# Patient Record
Sex: Female | Born: 1990 | Race: White | Hispanic: No | Marital: Married | Smoking: Never smoker
Health system: Southern US, Community
[De-identification: ages and names within clinical notes are randomized; demographics above are authoritative.]

## PROBLEM LIST (undated history)

## (undated) DIAGNOSIS — O139 Gestational [pregnancy-induced] hypertension without significant proteinuria, unspecified trimester: Secondary | ICD-10-CM

## (undated) DIAGNOSIS — F909 Attention-deficit hyperactivity disorder, unspecified type: Secondary | ICD-10-CM

## (undated) DIAGNOSIS — F32A Depression, unspecified: Secondary | ICD-10-CM

## (undated) HISTORY — DX: Gestational (pregnancy-induced) hypertension without significant proteinuria, unspecified trimester: O13.9

## (undated) HISTORY — DX: Depression, unspecified: F32.A

---
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
## 2015-07-01 ENCOUNTER — Encounter (HOSPITAL_COMMUNITY): Payer: Self-pay

## 2015-07-01 DIAGNOSIS — Z79899 Other long term (current) drug therapy: Secondary | ICD-10-CM | POA: Diagnosis not present

## 2015-07-01 DIAGNOSIS — R197 Diarrhea, unspecified: Secondary | ICD-10-CM | POA: Insufficient documentation

## 2015-07-01 DIAGNOSIS — R000 Tachycardia, unspecified: Secondary | ICD-10-CM | POA: Insufficient documentation

## 2015-07-01 DIAGNOSIS — Z3202 Encounter for pregnancy test, result negative: Secondary | ICD-10-CM | POA: Insufficient documentation

## 2015-07-01 DIAGNOSIS — Z793 Long term (current) use of hormonal contraceptives: Secondary | ICD-10-CM | POA: Insufficient documentation

## 2015-07-01 DIAGNOSIS — R112 Nausea with vomiting, unspecified: Secondary | ICD-10-CM | POA: Insufficient documentation

## 2015-07-01 LAB — CBC
HCT: 41.9 % (ref 36.0–46.0)
HEMOGLOBIN: 14.4 g/dL (ref 12.0–15.0)
MCH: 29.4 pg (ref 26.0–34.0)
MCHC: 34.4 g/dL (ref 30.0–36.0)
MCV: 85.5 fL (ref 78.0–100.0)
Platelets: 369 10*3/uL (ref 150–400)
RBC: 4.9 MIL/uL (ref 3.87–5.11)
RDW: 12.1 % (ref 11.5–15.5)
WBC: 8.6 10*3/uL (ref 4.0–10.5)

## 2015-07-01 LAB — COMPREHENSIVE METABOLIC PANEL
ALT: 19 U/L (ref 14–54)
AST: 20 U/L (ref 15–41)
Albumin: 3.8 g/dL (ref 3.5–5.0)
Alkaline Phosphatase: 66 U/L (ref 38–126)
Anion gap: 11 (ref 5–15)
BUN: 15 mg/dL (ref 6–20)
CALCIUM: 9.9 mg/dL (ref 8.9–10.3)
CHLORIDE: 103 mmol/L (ref 101–111)
CO2: 25 mmol/L (ref 22–32)
Creatinine, Ser: 0.93 mg/dL (ref 0.44–1.00)
Glucose, Bld: 126 mg/dL — ABNORMAL HIGH (ref 65–99)
POTASSIUM: 3.8 mmol/L (ref 3.5–5.1)
Sodium: 139 mmol/L (ref 135–145)
Total Bilirubin: 0.7 mg/dL (ref 0.3–1.2)
Total Protein: 7.7 g/dL (ref 6.5–8.1)

## 2015-07-01 LAB — URINALYSIS, ROUTINE W REFLEX MICROSCOPIC
Glucose, UA: NEGATIVE mg/dL
HGB URINE DIPSTICK: NEGATIVE
Ketones, ur: 15 mg/dL — AB
Leukocytes, UA: NEGATIVE
Nitrite: NEGATIVE
Protein, ur: 30 mg/dL — AB
SPECIFIC GRAVITY, URINE: 1.042 — AB (ref 1.005–1.030)
pH: 6 (ref 5.0–8.0)

## 2015-07-01 LAB — I-STAT BETA HCG BLOOD, ED (MC, WL, AP ONLY)

## 2015-07-01 LAB — URINE MICROSCOPIC-ADD ON: RBC / HPF: NONE SEEN RBC/hpf (ref 0–5)

## 2015-07-01 LAB — LIPASE, BLOOD: LIPASE: 20 U/L (ref 11–51)

## 2015-07-01 MED ORDER — ONDANSETRON 4 MG PO TBDP
ORAL_TABLET | ORAL | Status: AC
  Filled 2015-07-01: qty 1

## 2015-07-01 MED ORDER — ONDANSETRON 4 MG PO TBDP
4.0000 mg | ORAL_TABLET | Freq: Once | ORAL | Status: AC | PRN
  Administered 2015-07-01: 4 mg via ORAL

## 2015-07-01 NOTE — ED Notes (Signed)
Mother of patient asked this RN to check on her daughter as she felt warmer to mother. Oral temp currently stable. HR improved at this time.

## 2015-07-01 NOTE — ED Notes (Signed)
Onset yesterday 7pm vomiting x 20 today- yellow, diarrhea x 7 today- clear, watery. No fever, cold/cough, sore throat.  Unable to tolerate PO fluids.  Last urinated 2 hours ago, mouth moist.

## 2015-07-02 ENCOUNTER — Emergency Department (HOSPITAL_COMMUNITY)
Admission: EM | Admit: 2015-07-02 | Discharge: 2015-07-02 | Disposition: A | Discharge status: Home / Self Care | Payer: BLUE CROSS/BLUE SHIELD | Attending: Emergency Medicine | Admitting: Emergency Medicine

## 2015-07-02 DIAGNOSIS — R112 Nausea with vomiting, unspecified: Secondary | ICD-10-CM

## 2015-07-02 DIAGNOSIS — R197 Diarrhea, unspecified: Secondary | ICD-10-CM

## 2015-07-02 MED ORDER — SODIUM CHLORIDE 0.9 % IV BOLUS (SEPSIS)
1000.0000 mL | Freq: Once | INTRAVENOUS | Status: AC
  Administered 2015-07-02: 1000 mL via INTRAVENOUS

## 2015-07-02 MED ORDER — PROMETHAZINE HCL 25 MG PO TABS: 25.0000 mg | ORAL_TABLET | Freq: Four times a day (QID) | ORAL | Status: DC | PRN

## 2015-07-02 MED ORDER — ONDANSETRON 4 MG PO TBDP: 4.0000 mg | ORAL_TABLET | Freq: Three times a day (TID) | ORAL | Status: DC | PRN

## 2015-07-02 NOTE — ED Provider Notes (Signed)
CSN: [id number]     Arrival date & time 07/01/15  2010 History  By signing my name below, I, Phillis HaggisGabriella Gaje, attest that this documentation has been prepared under the direction and in the presence of Benjiman CoreNathan Nazareth Norenberg, MD. Electronically Signed: Phillis HaggisGabriella Gaje, ED Scribe. 07/02/2015. 1:06 AM.   Chief Complaint  Patient presents with  . Emesis  . Diarrhea   The history is provided by the patient. No language interpreter was used.  HPI Comments: Sonya Ali is a 25 y.o. female who presents to the Emergency Department complaining of vomiting x20 and clear, watery diarrhea x7 onset one day ago. Pt's nausea has improved with Zofran given in the ED. She has not had diarrhea since arriving to the ED, but also states that she has not eaten much today. She is unable to tolerate PO fluids. She denies fever, chills, cough, or sore throat. She denies allergies to medication or chance of pregnancy.  No past medical history on file. Past Surgical History  Procedure Laterality Date  . Leg surgery  2007    osteomylitis   No family history on file. Social History  Substance Use Topics  . Smoking status: Never Smoker   . Smokeless tobacco: Not on file  . Alcohol Use: Yes     Comment: rarely   OB History    No data available     Review of Systems  Constitutional: Negative for fever and chills.  HENT: Negative for sore throat.   Respiratory: Negative for cough.   Gastrointestinal: Positive for nausea, vomiting and diarrhea.  All other systems reviewed and are negative.  Allergies  Review of patient's allergies indicates no known allergies.  Home Medications   Prior to Admission medications   Medication Sig Start Date End Date Taking? Authorizing Provider  amphetamine-dextroamphetamine (ADDERALL) 30 MG tablet Take 30 mg by mouth 2 (two) times daily.   Yes Historical Provider, MD  Norethindrone-Ethinyl Estradiol-Fe Biphas (LO LOESTRIN FE) 1 MG-10 MCG / 10 MCG tablet Take 1 tablet by mouth  daily.   Yes Historical Provider, MD  ondansetron (ZOFRAN-ODT) 4 MG disintegrating tablet Take 1 tablet (4 mg total) by mouth every 8 (eight) hours as needed for nausea or vomiting. 07/02/15   Benjiman CoreNathan Brenetta Penny, MD  promethazine (PHENERGAN) 25 MG tablet Take 1 tablet (25 mg total) by mouth every 6 (six) hours as needed for nausea. 07/02/15   Benjiman CoreNathan Taylormarie Register, MD   BP 121/66 mmHg  Pulse 99  Temp(Src) 98.7 F (37.1 C) (Oral)  Resp 16  Ht 5\' 4"  (1.626 m)  Wt 208 lb (94.348 kg)  BMI 35.69 kg/m2  SpO2 96%  LMP 06/03/2015 Physical Exam  Constitutional: She is oriented to person, place, and time. She appears well-developed and well-nourished.  HENT:  Head: Normocephalic and atraumatic.  Eyes: EOM are normal. Pupils are equal, round, and reactive to light.  Neck: Normal range of motion. Neck supple.  Cardiovascular: Regular rhythm and normal heart sounds.  Tachycardia present.  Exam reveals no gallop and no friction rub.   No murmur heard. Mild tachycardia  Pulmonary/Chest: Effort normal and breath sounds normal. She has no wheezes.  Abdominal: Soft. There is no tenderness.  Musculoskeletal: Normal range of motion.  Neurological: She is alert and oriented to person, place, and time.  Skin: Skin is warm and dry.  Psychiatric: She has a normal mood and affect. Her behavior is normal.  Nursing note and vitals reviewed.   ED Course  Procedures (including critical care time) DIAGNOSTIC  STUDIES: Oxygen Saturation is 99% on RA, normal by my interpretation.    COORDINATION OF CARE: 1:01 AM-Discussed treatment plan which includes labs and IV fluids with pt at bedside and pt agreed to plan.    Labs Review Labs Reviewed  COMPREHENSIVE METABOLIC PANEL - Abnormal; Notable for the following:    Glucose, Bld 126 (*)    All other components within normal limits  URINALYSIS, ROUTINE W REFLEX MICROSCOPIC (NOT AT [facility]) - Abnormal; Notable for the following:    Color, Urine AMBER (*)    Specific  Gravity, Urine 1.042 (*)    Bilirubin Urine SMALL (*)    Ketones, ur 15 (*)    Protein, ur 30 (*)    All other components within normal limits  URINE MICROSCOPIC-ADD ON - Abnormal; Notable for the following:    Squamous Epithelial / LPF 6-30 (*)    Bacteria, UA RARE (*)    All other components within normal limits  LIPASE, BLOOD  CBC  I-STAT BETA HCG BLOOD, ED ([facility], [facility], [facility] ONLY)    Imaging Review No results found. I have personally reviewed and evaluated these lab results as part of my medical decision-making.   EKG Interpretation None      MDM   Final diagnoses:  Nausea vomiting and diarrhea    Patient nausea vomiting diarrhea. Feels better after treatment. Has tolerated orals will be discharged home. I personally performed the services described in this documentation, which was scribed in my presence. The recorded information has been reviewed and is accurate.      Benjiman Core, MD 07/02/15 [phone]

## 2015-07-02 NOTE — Discharge Instructions (Signed)

## 2017-07-28 ENCOUNTER — Emergency Department (HOSPITAL_COMMUNITY)
Admission: EM | Admit: 2017-07-28 | Discharge: 2017-07-28 | Discharge status: Against Medical Advice | Payer: BLUE CROSS/BLUE SHIELD

## 2017-07-28 NOTE — ED Notes (Signed)
Patient reports that she will go to urgent care in the morning. Does not want to wait.

## 2018-11-18 ENCOUNTER — Other Ambulatory Visit: Payer: Self-pay

## 2018-11-18 ENCOUNTER — Inpatient Hospital Stay (HOSPITAL_COMMUNITY)
Admission: AD | Admit: 2018-11-18 | Discharge: 2018-11-19 | Disposition: A | Discharge status: Home / Self Care | Payer: BC Managed Care – PPO | Attending: Obstetrics and Gynecology | Admitting: Obstetrics and Gynecology

## 2018-11-18 ENCOUNTER — Encounter (HOSPITAL_COMMUNITY): Payer: Self-pay

## 2018-11-18 DIAGNOSIS — Z3A08 8 weeks gestation of pregnancy: Secondary | ICD-10-CM | POA: Insufficient documentation

## 2018-11-18 DIAGNOSIS — Z3A01 Less than 8 weeks gestation of pregnancy: Secondary | ICD-10-CM | POA: Diagnosis not present

## 2018-11-18 DIAGNOSIS — O210 Mild hyperemesis gravidarum: Secondary | ICD-10-CM | POA: Diagnosis present

## 2018-11-18 DIAGNOSIS — O219 Vomiting of pregnancy, unspecified: Secondary | ICD-10-CM

## 2018-11-18 DIAGNOSIS — O211 Hyperemesis gravidarum with metabolic disturbance: Secondary | ICD-10-CM | POA: Diagnosis not present

## 2018-11-18 LAB — URINALYSIS, ROUTINE W REFLEX MICROSCOPIC
Bilirubin Urine: NEGATIVE
Glucose, UA: NEGATIVE mg/dL
Hgb urine dipstick: NEGATIVE
Ketones, ur: NEGATIVE mg/dL
Nitrite: NEGATIVE
Protein, ur: NEGATIVE mg/dL
Specific Gravity, Urine: 1.025 (ref 1.005–1.030)
pH: 5 (ref 5.0–8.0)

## 2018-11-18 LAB — POCT PREGNANCY, URINE: Preg Test, Ur: POSITIVE — AB

## 2018-11-18 MED ORDER — PROMETHAZINE HCL 25 MG/ML IJ SOLN
12.5000 mg | Freq: Once | INTRAMUSCULAR | Status: AC
  Administered 2018-11-19: 12.5 mg via INTRAVENOUS
  Filled 2018-11-18: qty 1

## 2018-11-18 MED ORDER — DEXAMETHASONE SODIUM PHOSPHATE 10 MG/ML IJ SOLN
10.0000 mg | Freq: Once | INTRAMUSCULAR | Status: AC
  Administered 2018-11-18: 10 mg via INTRAVENOUS
  Filled 2018-11-18: qty 1

## 2018-11-18 MED ORDER — SODIUM CHLORIDE 0.9 % IV SOLN
INTRAVENOUS | Status: DC
  Administered 2018-11-18: via INTRAVENOUS

## 2018-11-18 NOTE — MAU Provider Note (Signed)
Chief Complaint: Emesis During Pregnancy   First Provider Initiated Contact with Patient 11/18/18 2316        SUBJECTIVE HPI: Sonya Ali is a 28 y.o. G1P0 at [redacted]w[redacted]d by LMP who presents to maternity admissions reporting vomiting today.  Recently found out she was pregnant.  Today has not been able to keep anything down.  States her doctor told her to come in for IV.Marland Kitchen She denies vaginal bleeding, vaginal itching/burning, urinary symptoms, h/a, dizziness, or fever/chills.     RN Note: Found out 4 days ago - pregnant and not able to keep anything down. Vomited 3-4 times today.  Tried throughout the day: saltines, water, sparkling water, pedilyte, oatmeal, applesauce- really didn't keep down.  A lot of nausea.  No bleeding.   History reviewed. No pertinent past medical history. Past Surgical History:  Procedure Laterality Date  . LEG SURGERY  2007   osteomylitis   Social History   Socioeconomic History  . Marital status: Married    Spouse name: Not on file  . Number of children: Not on file  . Years of education: Not on file  . Highest education level: Not on file  Occupational History  . Not on file  Social Needs  . Financial resource strain: Not on file  . Food insecurity    Worry: Not on file    Inability: Not on file  . Transportation needs    Medical: Not on file    Non-medical: Not on file  Tobacco Use  . Smoking status: Never Smoker  . Smokeless tobacco: Never Used  Substance and Sexual Activity  . Alcohol use: Not Currently    Comment: rarely  . Drug use: No  . Sexual activity: Not Currently  Lifestyle  . Physical activity    Days per week: Not on file    Minutes per session: Not on file  . Stress: Not on file  Relationships  . Social Herbalist on phone: Not on file    Gets together: Not on file    Attends religious service: Not on file    Active member of club or organization: Not on file    Attends meetings of clubs or organizations: Not  on file    Relationship status: Not on file  . Intimate partner violence    Fear of current or ex partner: Not on file    Emotionally abused: Not on file    Physically abused: Not on file    Forced sexual activity: Not on file  Other Topics Concern  . Not on file  Social History Narrative  . Not on file   No current facility-administered medications on file prior to encounter.    Current Outpatient Medications on File Prior to Encounter  Medication Sig Dispense Refill  . amphetamine-dextroamphetamine (ADDERALL) 30 MG tablet Take 30 mg by mouth 2 (two) times daily.    . Norethindrone-Ethinyl Estradiol-Fe Biphas (LO LOESTRIN FE) 1 MG-10 MCG / 10 MCG tablet Take 1 tablet by mouth daily.    . ondansetron (ZOFRAN-ODT) 4 MG disintegrating tablet Take 1 tablet (4 mg total) by mouth every 8 (eight) hours as needed for nausea or vomiting. 10 tablet 0  . promethazine (PHENERGAN) 25 MG tablet Take 1 tablet (25 mg total) by mouth every 6 (six) hours as needed for nausea. 10 tablet 0   No Known Allergies  I have reviewed patient's Past Medical Hx, Surgical Hx, Family Hx, Social Hx, medications and allergies.  ROS:  Review of Systems  Gastrointestinal: Positive for nausea and vomiting. Negative for constipation and diarrhea.  Genitourinary: Negative for pelvic pain and vaginal bleeding.  Musculoskeletal: Negative for back pain.  Neurological: Negative for dizziness.   Review of Systems  Other systems negative   Physical Exam  Physical Exam Patient Vitals for the past 24 hrs:  BP Temp Temp src Pulse Resp SpO2 Height Weight  11/18/18 2103 130/69 98.5 F (36.9 C) Oral 87 16 96 % 5\' 3"  (1.6 m) 88.5 kg   Constitutional: Well-developed female in no acute distress.  Cardiovascular: normal rate Respiratory: normal effort GI: Abd soft, non-tender. Pos BS x 4 MS: Extremities nontender, no edema, normal ROM Neurologic: Alert and oriented x 4.  GU: Neg CVAT.  PELVIC EXAM: deferred  LAB  RESULTS Results for orders placed or performed during the hospital encounter of 11/18/18 (from the past 24 hour(s))  Urinalysis, Routine w reflex microscopic     Status: Abnormal   Collection Time: 11/18/18  9:59 PM  Result Value Ref Range   Color, Urine YELLOW YELLOW   APPearance HAZY (A) CLEAR   Specific Gravity, Urine 1.025 1.005 - 1.030   pH 5.0 5.0 - 8.0   Glucose, UA NEGATIVE NEGATIVE mg/dL   Hgb urine dipstick NEGATIVE NEGATIVE   Bilirubin Urine NEGATIVE NEGATIVE   Ketones, ur NEGATIVE NEGATIVE mg/dL   Protein, ur NEGATIVE NEGATIVE mg/dL   Nitrite NEGATIVE NEGATIVE   Leukocytes,Ua TRACE (A) NEGATIVE   RBC / HPF 0-5 0 - 5 RBC/hpf   WBC, UA 0-5 0 - 5 WBC/hpf   Bacteria, UA RARE (A) NONE SEEN   Squamous Epithelial / LPF 11-20 0 - 5   Mucus PRESENT   Pregnancy, urine POC     Status: Abnormal   Collection Time: 11/18/18 10:02 PM  Result Value Ref Range   Preg Test, Ur POSITIVE (A) NEGATIVE       IMAGING No results found.  MAU Management/MDM: Ordered IV with Normal Saline infusion Will give Phenergan and Decadron for nausea  After one liter of fluid and meds, pt felt much better and tolerated PO intake  ASSESSMENT Single intrauterine pregnancy at 561w4d Nausea and vomiting Mild dehydration  PLAN Discharge home Rx Declegis with instructions on how to take it.  Discussed how to advance diet as tolerated  Pt stable at time of discharge. Encouraged to return here or to other Urgent Care/ED if she develops worsening of symptoms, increase in pain, fever, or other concerning symptoms.    Wynelle BourgeoisMarie Caidan Hubbert CNM, MSN Certified Nurse-Midwife 11/18/2018  11:16 PM

## 2018-11-18 NOTE — MAU Note (Signed)
Found out 4 days ago - pregnant and not able to keep anything down. Vomited 3-4 times today.  Tried throughout the day: saltines, water, sparkling water, pedilyte, oatmeal, applesauce- really didn't keep down.  A lot of nausea.  No bleeding.

## 2018-11-19 MED ORDER — DOXYLAMINE-PYRIDOXINE 10-10 MG PO TBEC: 2.0000 | DELAYED_RELEASE_TABLET | Freq: Every evening | ORAL | 1 refills | Status: DC | PRN

## 2018-11-19 NOTE — Discharge Instructions (Signed)
Morning Sickness ° °Morning sickness is when a woman feels nauseous during pregnancy. This nauseous feeling may or may not come with vomiting. It often occurs in the morning, but it can be a problem at any time of day. Morning sickness is most common during the first trimester. In some cases, it may continue throughout pregnancy. Although morning sickness is unpleasant, it is usually harmless unless the woman develops severe and continual vomiting (hyperemesis gravidarum), a condition that requires more intense treatment. °What are the causes? °The exact cause of this condition is not known, but it seems to be related to normal hormonal changes that occur in pregnancy. °What increases the risk? °You are more likely to develop this condition if: °· You experienced nausea or vomiting before your pregnancy. °· You had morning sickness during a previous pregnancy. °· You are pregnant with more than one baby, such as twins. °What are the signs or symptoms? °Symptoms of this condition include: °· Nausea. °· Vomiting. °How is this diagnosed? °This condition is usually diagnosed based on your signs and symptoms. °How is this treated? °In many cases, treatment is not needed for this condition. Making some changes to what you eat may help to control symptoms. Your health care provider may also prescribe or recommend: °· Vitamin B6 supplements. °· Anti-nausea medicines. °· Ginger. °Follow these instructions at home: °Medicines °· Take over-the-counter and prescription medicines only as told by your health care provider. Do not use any prescription, over-the-counter, or herbal medicines for morning sickness without first talking with your health care provider. °· Taking multivitamins before getting pregnant can prevent or decrease the severity of morning sickness in most women. °Eating and drinking °· Eat a piece of dry toast or crackers before getting out of bed in the morning. °· Eat 5 or 6 small meals a day. °· Eat dry and  bland foods, such as rice or a baked potato. Foods that are high in carbohydrates are often helpful. °· Avoid greasy, fatty, and spicy foods. °· Have someone cook for you if the smell of any food causes nausea and vomiting. °· If you feel nauseous after taking prenatal vitamins, take the vitamins at night or with a snack. °· Snack on protein foods between meals if you are hungry. Nuts, yogurt, and cheese are good options. °· Drink fluids throughout the day. °· Try ginger ale made with real ginger, ginger tea made from fresh grated ginger, or ginger candies. °General instructions °· Do not use any products that contain nicotine or tobacco, such as cigarettes and e-cigarettes. If you need help quitting, ask your health care provider. °· Get an air purifier to keep the air in your house free of odors. °· Get plenty of fresh air. °· Try to avoid odors that trigger your nausea. °· Consider trying these methods to help relieve symptoms: °? Wearing an acupressure wristband. These wristbands are often worn for seasickness. °? Acupuncture. °Contact a health care provider if: °· Your home remedies are not working and you need medicine. °· You feel dizzy or light-headed. °· You are losing weight. °Get help right away if: °· You have persistent and uncontrolled nausea and vomiting. °· You faint. °· You have severe pain in your abdomen. °Summary °· Morning sickness is when a woman feels nauseous during pregnancy. This nauseous feeling may or may not come with vomiting. °· Morning sickness is most common during the first trimester. °· It often occurs in the morning, but it can be a problem at   any time of day. °· In many cases, treatment is not needed for this condition. Making some changes to what you eat may help to control symptoms. °This information is not intended to replace advice given to you by your health care provider. Make sure you discuss any questions you have with your health care provider. °Document Released:  06/05/2006 Document Revised: 03/27/2017 Document Reviewed: 05/17/2016 °Elsevier Patient Education © 2020 [employer] ° °

## 2018-12-03 DIAGNOSIS — Z3490 Encounter for supervision of normal pregnancy, unspecified, unspecified trimester: Secondary | ICD-10-CM | POA: Insufficient documentation

## 2018-12-10 LAB — OB RESULTS CONSOLE HEPATITIS B SURFACE ANTIGEN: Hepatitis B Surface Ag: NEGATIVE

## 2018-12-10 LAB — OB RESULTS CONSOLE HIV ANTIBODY (ROUTINE TESTING): HIV: NONREACTIVE

## 2018-12-10 LAB — OB RESULTS CONSOLE RUBELLA ANTIBODY, IGM: Rubella: IMMUNE

## 2018-12-10 LAB — OB RESULTS CONSOLE ABO/RH: RH Type: POSITIVE

## 2018-12-10 LAB — OB RESULTS CONSOLE GC/CHLAMYDIA
Chlamydia: NEGATIVE
Gonorrhea: NEGATIVE

## 2018-12-10 LAB — OB RESULTS CONSOLE ANTIBODY SCREEN: Antibody Screen: NEGATIVE

## 2018-12-10 LAB — OB RESULTS CONSOLE RPR: RPR: NONREACTIVE

## 2018-12-13 LAB — OB RESULTS CONSOLE VARICELLA ZOSTER ANTIBODY, IGG: Varicella: IMMUNE

## 2019-04-29 NOTE — L&D Delivery Note (Signed)
Delivery Note She made steady progress, reached complete and pushed for less than an hour.  At 3:43 AM a viable female was delivered via Vaginal, Spontaneous (Presentation: Left Occiput Anterior).  APGAR: 8, 9; weight pending.   Placenta status: Spontaneous, Intact.  Cord: 2 vessels with the following complications: None.  Anesthesia: Epidural Episiotomy: Median Lacerations:  none Suture Repair: 3.0 vicryl rapide Est. Blood Loss (mL):  350  Mom to postpartum.  Baby to Couplet care / Skin to Skin.  Will monitor BP, start Procardia if necessary  Zenaida Niece 06/21/2019, 4:09 AM

## 2019-06-14 ENCOUNTER — Other Ambulatory Visit: Payer: Self-pay

## 2019-06-14 ENCOUNTER — Inpatient Hospital Stay (HOSPITAL_COMMUNITY)
Admission: AD | Admit: 2019-06-14 | Discharge: 2019-06-14 | Disposition: A | Discharge status: Home / Self Care | Payer: BC Managed Care – PPO | Attending: Obstetrics and Gynecology | Admitting: Obstetrics and Gynecology

## 2019-06-14 ENCOUNTER — Encounter (HOSPITAL_COMMUNITY): Payer: Self-pay | Admitting: Obstetrics and Gynecology

## 2019-06-14 DIAGNOSIS — O1493 Unspecified pre-eclampsia, third trimester: Secondary | ICD-10-CM

## 2019-06-14 DIAGNOSIS — Z79899 Other long term (current) drug therapy: Secondary | ICD-10-CM | POA: Diagnosis not present

## 2019-06-14 DIAGNOSIS — F909 Attention-deficit hyperactivity disorder, unspecified type: Secondary | ICD-10-CM | POA: Diagnosis not present

## 2019-06-14 DIAGNOSIS — O99343 Other mental disorders complicating pregnancy, third trimester: Secondary | ICD-10-CM | POA: Insufficient documentation

## 2019-06-14 DIAGNOSIS — Z3689 Encounter for other specified antenatal screening: Secondary | ICD-10-CM

## 2019-06-14 DIAGNOSIS — Z3A35 35 weeks gestation of pregnancy: Secondary | ICD-10-CM | POA: Insufficient documentation

## 2019-06-14 DIAGNOSIS — Z3A36 36 weeks gestation of pregnancy: Secondary | ICD-10-CM | POA: Diagnosis not present

## 2019-06-14 HISTORY — DX: Attention-deficit hyperactivity disorder, unspecified type: F90.9

## 2019-06-14 LAB — COMPREHENSIVE METABOLIC PANEL
ALT: 19 U/L (ref 0–44)
AST: 25 U/L (ref 15–41)
Albumin: 2.9 g/dL — ABNORMAL LOW (ref 3.5–5.0)
Alkaline Phosphatase: 149 U/L — ABNORMAL HIGH (ref 38–126)
Anion gap: 13 (ref 5–15)
BUN: 8 mg/dL (ref 6–20)
CO2: 20 mmol/L — ABNORMAL LOW (ref 22–32)
Calcium: 9.7 mg/dL (ref 8.9–10.3)
Chloride: 105 mmol/L (ref 98–111)
Creatinine, Ser: 0.76 mg/dL (ref 0.44–1.00)
GFR calc Af Amer: >60 mL/min (ref >60)
GFR calc non Af Amer: >60 mL/min (ref >60)
Glucose, Bld: 79 mg/dL (ref 70–99)
Potassium: 3.8 mmol/L (ref 3.5–5.1)
Sodium: 138 mmol/L (ref 135–145)
Total Bilirubin: 0.5 mg/dL (ref 0.3–1.2)
Total Protein: 6.1 g/dL — ABNORMAL LOW (ref 6.5–8.1)

## 2019-06-14 LAB — CBC
HCT: 38.3 % (ref 36.0–46.0)
Hemoglobin: 13 g/dL (ref 12.0–15.0)
MCH: 30.4 pg (ref 26.0–34.0)
MCHC: 33.9 g/dL (ref 30.0–36.0)
MCV: 89.5 fL (ref 80.0–100.0)
Platelets: 338 10*3/uL (ref 150–400)
RBC: 4.28 MIL/uL (ref 3.87–5.11)
RDW: 12.6 % (ref 11.5–15.5)
WBC: 11.5 10*3/uL — ABNORMAL HIGH (ref 4.0–10.5)
nRBC: 0 % (ref 0.0–0.2)

## 2019-06-14 LAB — PROTEIN / CREATININE RATIO, URINE
Creatinine, Urine: 33.89 mg/dL
Protein Creatinine Ratio: 3.22 mg/mg{Cre} — ABNORMAL HIGH (ref 0.00–0.15)
Total Protein, Urine: 109 mg/dL

## 2019-06-14 NOTE — MAU Provider Note (Signed)
Chief Complaint:  Hypertension and fetal tachycardia   First Provider Initiated Contact with Patient 06/14/19 1712     HPI: Sonya Ali is a 29 y.o. G1P0 at [redacted]w[redacted]d who presents to maternity admissions from the office for fetal monitoring. Patient was in the office for a routine ob appointment today. On doppler, FHR was 170s-200s. Was sent for monitoring. Reports normal fetal movement.  While at her appointment, Dr. Jackelyn Knife checked her cervix. Was told her cervix is closed but noted bright red bleeding immediately after the exam.  Denies history of hypertension. Had an elevated BP in the office a few weeks ago. Denies headache, visual disturbance, or epigastric pain.   Pregnancy Course: [facility]  Past Medical History:  Diagnosis Date  . ADHD    OB History  Gravida Para Term Preterm AB Living  1            SAB TAB Ectopic Multiple Live Births               # Outcome Date GA Lbr Len/2nd Weight Sex Delivery Anes PTL Lv  1 Current            Past Surgical History:  Procedure Laterality Date  . LEG SURGERY  2007   osteomylitis   History reviewed. No pertinent family history. Social History   Tobacco Use  . Smoking status: Never Smoker  . Smokeless tobacco: Never Used  Substance Use Topics  . Alcohol use: Not Currently    Comment: rarely  . Drug use: No   No Known Allergies Medications Prior to Admission  Medication Sig Dispense Refill Last Dose  . amphetamine-dextroamphetamine (ADDERALL) 30 MG tablet Take 30 mg by mouth 2 (two) times daily.   06/14/2019 at Unknown time  . Doxylamine-Pyridoxine (DICLEGIS) 10-10 MG TBEC Take 2 tablets by mouth at bedtime as needed. 100 tablet 1   . ondansetron (ZOFRAN-ODT) 4 MG disintegrating tablet Take 1 tablet (4 mg total) by mouth every 8 (eight) hours as needed for nausea or vomiting. 10 tablet 0   . promethazine (PHENERGAN) 25 MG tablet Take 1 tablet (25 mg total) by mouth every 6 (six) hours as needed for nausea. 10 tablet  0     I have reviewed patient's Past Medical Hx, Surgical Hx, Family Hx, Social Hx, medications and allergies.   ROS:  Review of Systems  Constitutional: Negative.   Eyes: Negative for visual disturbance.  Gastrointestinal: Negative.   Genitourinary: Positive for vaginal bleeding. Negative for dysuria and vaginal discharge.  Neurological: Negative for headaches.    Physical Exam   Patient Vitals for the past 24 hrs:  BP Temp Temp src Pulse Resp SpO2  06/14/19 1900 131/79 - - (!) 111 - -  06/14/19 1845 121/67 - - (!) 113 - 99 %  06/14/19 1830 103/64 - - 99 - 98 %  06/14/19 1815 135/76 - - (!) 122 - 99 %  06/14/19 1801 138/68 - - (!) 118 - -  06/14/19 1745 136/81 - - (!) 109 - 98 %  06/14/19 1730 (!) 147/92 - - (!) 126 - 99 %  06/14/19 1724 (!) 140/99 - - (!) 119 - -  06/14/19 1708 - - - - - 99 %  06/14/19 1703 (!) 140/93 98.6 F (37 C) Oral (!) 126 20 99 %    Constitutional: Well-developed, well-nourished female in no acute distress.  Cardiovascular: normal rate & rhythm, no murmur Respiratory: normal effort, lung sounds clear throughout GI: Abd soft, non-tender, gravid  appropriate for gestational age. Pos BS x 4 MS: Extremities nontender, non pitting edema, normal ROM Neurologic: Alert and oriented x 4.  GU: minimal amount of dark red blood on pad. No active bleeding.   NST:  Baseline: 145 bpm, Variability: Good {> 6 bpm), Accelerations: Reactive and Decelerations: Absent   Labs: Results for orders placed or performed during the hospital encounter of 06/14/19 (from the past 24 hour(s))  Protein / creatinine ratio, urine     Status: Abnormal   Collection Time: 06/14/19  5:36 PM  Result Value Ref Range   Creatinine, Urine 33.89 mg/dL   Total Protein, Urine 109 mg/dL   Protein Creatinine Ratio 3.22 (H) 0.00 - 0.15 mg/mg[Cre]  CBC     Status: Abnormal   Collection Time: 06/14/19  5:50 PM  Result Value Ref Range   WBC 11.5 (H) 4.0 - 10.5 K/uL   RBC 4.28 3.87 - 5.11  MIL/uL   Hemoglobin 13.0 12.0 - 15.0 g/dL   HCT 38.3 36.0 - 46.0 %   MCV 89.5 80.0 - 100.0 fL   MCH 30.4 26.0 - 34.0 pg   MCHC 33.9 30.0 - 36.0 g/dL   RDW 12.6 11.5 - 15.5 %   Platelets 338 150 - 400 K/uL   nRBC 0.0 0.0 - 0.2 %  Comprehensive metabolic panel     Status: Abnormal   Collection Time: 06/14/19  5:50 PM  Result Value Ref Range   Sodium 138 135 - 145 mmol/L   Potassium 3.8 3.5 - 5.1 mmol/L   Chloride 105 98 - 111 mmol/L   CO2 20 (L) 22 - 32 mmol/L   Glucose, Bld 79 70 - 99 mg/dL   BUN 8 6 - 20 mg/dL   Creatinine, Ser 0.76 0.44 - 1.00 mg/dL   Calcium 9.7 8.9 - 10.3 mg/dL   Total Protein 6.1 (L) 6.5 - 8.1 g/dL   Albumin 2.9 (L) 3.5 - 5.0 g/dL   AST 25 15 - 41 U/L   ALT 19 0 - 44 U/L   Alkaline Phosphatase 149 (H) 38 - 126 U/L   Total Bilirubin 0.5 0.3 - 1.2 mg/dL   GFR calc non Af Amer >60 >60 mL/min   GFR calc Af Amer >60 >60 mL/min   Anion gap 13 5 - 15    Imaging:  No results found.  MAU Course: Orders Placed This Encounter  Procedures  . CBC  . Comprehensive metabolic panel  . Protein / creatinine ratio, urine  . Discharge patient   No orders of the defined types were placed in this encounter.   MDM: Reactive NST with normal baseline. Active fetal movement.   RH positive per prenatal record. Pt reports bright red bleeding immediately after cervical exam in the office. Denies abdominal pain. Bleeding stopped while in MAU.   Elevated BPs. Not severe range. PEC labs ordered. Patient had elevated BP in the office a few weeks ago but otherwise denies history of hypertension. Platelets, AST/ALT, & creatinine normal. Urine protein creatinine ratio elevated at 3.22 - no baseline labs to compare to.   Contacted Dr. Melba Coon to facilitate appropriate follow up. Patient is scheduled for next ob appointment on Monday but will be 37 wks on Sunday. Per Dr. Melba Coon, the office will contact the patient tomorrow to manage follow up appointment and schedule induction.    Assessment: 1. Pre-eclampsia in third trimester   2. [redacted] weeks gestation of pregnancy   3. NST (non-stress test) reactive  Plan: Discharge home in stable condition.  Preeclampsia & labor precautions Patient instructed to call the office tomorrow afternoon if she hasn't heard from her OB  Follow-up Information    [facility] Follow [facility].   Why: the office will call you tomorrow to schedule follow up appointment Contact information: [address]  [address] [place] [address] [phone]           Allergies as of 06/14/2019   No Known Allergies     Medication List    STOP taking these medications   amphetamine-dextroamphetamine 30 MG tablet Commonly known as: ADDERALL   Doxylamine-Pyridoxine 10-10 MG Tbec Commonly known as: Diclegis   ondansetron 4 MG disintegrating tablet Commonly known as: ZOFRAN-ODT   promethazine 25 MG tablet Commonly known as: Jeanne Ivan, NP 06/14/2019 7:31 PM

## 2019-06-14 NOTE — MAU Note (Signed)
Routine appt today, FH was up 170's  Up to 200. Sent over for further monitoring.   Has been having cramps.  Dr checked cervix (closed), "looked like a crime scene".  No prior hx of bleeding during preg. Still seeing when got here.

## 2019-06-14 NOTE — Discharge Instructions (Signed)
Hypertension During Pregnancy °High blood pressure (hypertension) is when the force of blood pumping through the arteries is too strong. Arteries are blood vessels that carry blood from the heart throughout the body. Hypertension during pregnancy can be mild or severe. Severe hypertension during pregnancy (preeclampsia) is a medical emergency that requires prompt evaluation and treatment. °Different types of hypertension can happen during pregnancy. These include: °· Chronic hypertension. This happens when you had high blood pressure before you became pregnant, and it continues during the pregnancy. Hypertension that develops before you are [redacted] weeks pregnant and continues during the pregnancy is also called chronic hypertension. If you have chronic hypertension, it will not go away after you have your baby. You will need follow-up visits with your health care provider after you have your baby. Your doctor may want you to keep taking medicine for your blood pressure. °· Gestational hypertension. This is hypertension that develops after the 20th week of pregnancy. Gestational hypertension usually goes away after you have your baby, but your health care provider will need to monitor your blood pressure to make sure that it is getting better. °· Preeclampsia. This is severe hypertension during pregnancy. This can cause serious complications for you and your baby and can also cause complications for you after the delivery of your baby. °· Postpartum preeclampsia. You may develop severe hypertension after giving birth. This usually occurs within 48 hours after childbirth but may occur up to 6 weeks after giving birth. This is rare. °How does this affect me? °Women who have hypertension during pregnancy have a greater chance of developing hypertension later in life or during future pregnancies. In some cases, hypertension during pregnancy can cause serious complications, such as: °· Stroke. °· Heart attack. °· Injury to  other organs, such as kidneys, lungs, or liver. °· Preeclampsia. °· Convulsions or seizures. °· Placental abruption. °How does this affect my baby? °Hypertension during pregnancy can affect your baby. Your baby may: °· Be born early (prematurely). °· Not weigh as much as he or she should at birth (low birth weight). °· Not tolerate labor well, leading to an unplanned cesarean delivery. °What are the risks? °There are certain factors that make it more likely for you to develop hypertension during pregnancy. These include: °· Having hypertension during a previous pregnancy. °· Being overweight. °· Being age 35 or older. °· Being pregnant for the first time. °· Being pregnant with more than one baby. °· Becoming pregnant using fertilization methods, such as IVF (in vitro fertilization). °· Having other medical problems, such as diabetes, kidney disease, or lupus. °· Having a family history of hypertension. °What can I do to lower my risk? °The exact cause of hypertension during pregnancy is not known. You may be able to lower your risk by: °· Maintaining a healthy weight. °· Eating a healthy and balanced diet. °· Following your health care provider's instructions about treating any long-term conditions that you had before becoming pregnant. °It is very important to keep all of your prenatal care appointments. Your health care provider will check your blood pressure and make sure that your pregnancy is progressing as expected. If a problem is found, early treatment can prevent complications. °How is this treated? °Treatment for hypertension during pregnancy varies depending on the type of hypertension you have and how serious it is. °· If you were taking medicine for high blood pressure before you became pregnant, talk with your health care provider. You may need to change medicine during pregnancy because   some medicines, like ACE inhibitors, may not be considered safe for your baby. °· If you have gestational  hypertension, your health care provider may order medicine to treat this during pregnancy. °· If you are at risk for preeclampsia, your health care provider may recommend that you take a low-dose aspirin during your pregnancy. °· If you have severe hypertension, you may need to be hospitalized so you and your baby can be monitored closely. You may also need to be given medicine to lower your blood pressure. This medicine may be given by mouth or through an IV. °· In some cases, if your condition gets worse, you may need to deliver your baby early. °Follow these instructions at home: °Eating and drinking ° °· Drink enough fluid to keep your urine pale yellow. °· Avoid caffeine. °Lifestyle °· Do not use any products that contain nicotine or tobacco, such as cigarettes, e-cigarettes, and chewing tobacco. If you need help quitting, ask your health care provider. °· Do not use alcohol or drugs. °· Avoid stress as much as possible. °· Rest and get plenty of sleep. °· Regular exercise can help to reduce your blood pressure. Ask your health care provider what kinds of exercise are best for you. °General instructions °· Take over-the-counter and prescription medicines only as told by your health care provider. °· Keep all prenatal and follow-up visits as told by your health care provider. This is important. °Contact a health care provider if: °· You have symptoms that your health care provider told you may require more treatment or monitoring, such as: °? Headaches. °? Nausea or vomiting. °? Abdominal pain. °? Dizziness. °? Light-headedness. °Get help right away if: °· You have: °? Severe abdominal pain that does not get better with treatment. °? A severe headache that does not get better. °? Vomiting that does not get better. °? Sudden, rapid weight gain. °? Sudden swelling in your hands, ankles, or face. °? Vaginal bleeding. °? Blood in your urine. °? Blurred or double vision. °? Shortness of breath or chest  pain. °? Weakness on one side of your body. °? Difficulty speaking. °· Your baby is not moving as much as usual. °Summary °· High blood pressure (hypertension) is when the force of blood pumping through the arteries is too strong. °· Hypertension during pregnancy can cause problems for you and your baby. °· Treatment for hypertension during pregnancy varies depending on the type of hypertension you have and how serious it is. °· Keep all prenatal and follow-up visits as told by your health care provider. This is important. °This information is not intended to replace advice given to you by your health care provider. Make sure you discuss any questions you have with your health care provider. °Document Revised: 08/05/2018 Document Reviewed: 05/11/2018 °Elsevier Patient Education © 2020 [employer] ° ° ° ° °Fetal Movement Counts °Patient Name: ________________________________________________ Patient Due Date: ____________________ °What is a fetal movement count? ° °A fetal movement count is the number of times that you feel your baby move during a certain amount of time. This may also be called a fetal kick count. A fetal movement count is recommended for every pregnant woman. You may be asked to start counting fetal movements as early as week 28 of your pregnancy. °Pay attention to when your baby is most active. You may notice your baby's sleep and wake cycles. You may also notice things that make your baby move more. You should do a fetal movement count: °· When   your baby is normally most active. °· At the same time each day. °A good time to count movements is while you are resting, after having something to eat and drink. °How do I count fetal movements? °1. Find a quiet, comfortable area. Sit, or lie down on your side. °2. Write down the date, the start time and stop time, and the number of movements that you felt between those two times. Take this information with you to your health care visits. °3. Write down  your start time when you feel the first movement. °4. Count kicks, flutters, swishes, rolls, and jabs. You should feel at least 10 movements. °5. You may stop counting after you have felt 10 movements, or if you have been counting for 2 hours. Write down the stop time. °6. If you do not feel 10 movements in 2 hours, contact your health care provider for further instructions. Your health care provider may want to do additional tests to assess your baby's well-being. °Contact a health care provider if: °· You feel fewer than 10 movements in 2 hours. °· Your baby is not moving like he or she usually does. °Date: ____________ Start time: ____________ Stop time: ____________ Movements: ____________ °Date: ____________ Start time: ____________ Stop time: ____________ Movements: ____________ °Date: ____________ Start time: ____________ Stop time: ____________ Movements: ____________ °Date: ____________ Start time: ____________ Stop time: ____________ Movements: ____________ °Date: ____________ Start time: ____________ Stop time: ____________ Movements: ____________ °Date: ____________ Start time: ____________ Stop time: ____________ Movements: ____________ °Date: ____________ Start time: ____________ Stop time: ____________ Movements: ____________ °Date: ____________ Start time: ____________ Stop time: ____________ Movements: ____________ °Date: ____________ Start time: ____________ Stop time: ____________ Movements: ____________ °This information is not intended to replace advice given to you by your health care provider. Make sure you discuss any questions you have with your health care provider. °Document Revised: 12/02/2018 Document Reviewed: 12/02/2018 °Elsevier Patient Education © 2020 [employer] ° °

## 2019-06-16 ENCOUNTER — Telehealth (HOSPITAL_COMMUNITY): Payer: Self-pay | Admitting: *Deleted

## 2019-06-16 ENCOUNTER — Encounter (HOSPITAL_COMMUNITY): Payer: Self-pay | Admitting: *Deleted

## 2019-06-16 NOTE — Telephone Encounter (Signed)
Preadmission screen  

## 2019-06-18 ENCOUNTER — Other Ambulatory Visit (HOSPITAL_COMMUNITY)
Admission: RE | Admit: 2019-06-18 | Discharge: 2019-06-18 | Disposition: A | Discharge status: Home / Self Care | Payer: BC Managed Care – PPO | Source: Ambulatory Visit | Attending: Obstetrics and Gynecology | Admitting: Obstetrics and Gynecology

## 2019-06-18 DIAGNOSIS — Z20822 Contact with and (suspected) exposure to covid-19: Secondary | ICD-10-CM | POA: Insufficient documentation

## 2019-06-18 DIAGNOSIS — Z01812 Encounter for preprocedural laboratory examination: Secondary | ICD-10-CM | POA: Diagnosis not present

## 2019-06-18 LAB — SARS CORONAVIRUS 2 (TAT 6-24 HRS): SARS Coronavirus 2: NEGATIVE

## 2019-06-19 ENCOUNTER — Other Ambulatory Visit: Payer: Self-pay | Admitting: Obstetrics and Gynecology

## 2019-06-20 ENCOUNTER — Inpatient Hospital Stay (HOSPITAL_COMMUNITY): Payer: BC Managed Care – PPO

## 2019-06-20 ENCOUNTER — Encounter (HOSPITAL_COMMUNITY): Payer: Self-pay | Admitting: Obstetrics and Gynecology

## 2019-06-20 ENCOUNTER — Inpatient Hospital Stay (HOSPITAL_COMMUNITY): Payer: BC Managed Care – PPO | Admitting: Anesthesiology

## 2019-06-20 ENCOUNTER — Other Ambulatory Visit: Payer: Self-pay

## 2019-06-20 ENCOUNTER — Inpatient Hospital Stay (HOSPITAL_COMMUNITY)
Admission: AD | Admit: 2019-06-20 | Discharge: 2019-06-23 | DRG: 807 | Disposition: A | Discharge status: Home / Self Care | Payer: BC Managed Care – PPO | Attending: Obstetrics and Gynecology | Admitting: Obstetrics and Gynecology

## 2019-06-20 DIAGNOSIS — O1404 Mild to moderate pre-eclampsia, complicating childbirth: Secondary | ICD-10-CM | POA: Diagnosis present

## 2019-06-20 DIAGNOSIS — Z3A37 37 weeks gestation of pregnancy: Secondary | ICD-10-CM

## 2019-06-20 DIAGNOSIS — O2243 Hemorrhoids in pregnancy, third trimester: Secondary | ICD-10-CM | POA: Diagnosis present

## 2019-06-20 DIAGNOSIS — O1493 Unspecified pre-eclampsia, third trimester: Secondary | ICD-10-CM

## 2019-06-20 DIAGNOSIS — O99824 Streptococcus B carrier state complicating childbirth: Secondary | ICD-10-CM | POA: Diagnosis present

## 2019-06-20 DIAGNOSIS — R58 Hemorrhage, not elsewhere classified: Secondary | ICD-10-CM

## 2019-06-20 DIAGNOSIS — O99214 Obesity complicating childbirth: Secondary | ICD-10-CM | POA: Diagnosis present

## 2019-06-20 DIAGNOSIS — O1494 Unspecified pre-eclampsia, complicating childbirth: Secondary | ICD-10-CM | POA: Diagnosis present

## 2019-06-20 DIAGNOSIS — O4693 Antepartum hemorrhage, unspecified, third trimester: Secondary | ICD-10-CM

## 2019-06-20 HISTORY — DX: Unspecified pre-eclampsia, third trimester: O14.93

## 2019-06-20 LAB — OB RESULTS CONSOLE GBS: GBS: POSITIVE

## 2019-06-20 LAB — COMPREHENSIVE METABOLIC PANEL
ALT: 19 U/L (ref 0–44)
AST: 26 U/L (ref 15–41)
Albumin: 2.8 g/dL — ABNORMAL LOW (ref 3.5–5.0)
Alkaline Phosphatase: 153 U/L — ABNORMAL HIGH (ref 38–126)
Anion gap: 8 (ref 5–15)
BUN: 12 mg/dL (ref 6–20)
CO2: 20 mmol/L — ABNORMAL LOW (ref 22–32)
Calcium: 9.5 mg/dL (ref 8.9–10.3)
Chloride: 106 mmol/L (ref 98–111)
Creatinine, Ser: 0.74 mg/dL (ref 0.44–1.00)
GFR calc Af Amer: >60 mL/min (ref >60)
GFR calc non Af Amer: >60 mL/min (ref >60)
Glucose, Bld: 88 mg/dL (ref 70–99)
Potassium: 3.8 mmol/L (ref 3.5–5.1)
Sodium: 134 mmol/L — ABNORMAL LOW (ref 135–145)
Total Bilirubin: 0.7 mg/dL (ref 0.3–1.2)
Total Protein: 6.2 g/dL — ABNORMAL LOW (ref 6.5–8.1)

## 2019-06-20 LAB — TYPE AND SCREEN
ABO/RH(D): A POS
Antibody Screen: NEGATIVE

## 2019-06-20 LAB — CBC
HCT: 36.7 % (ref 36.0–46.0)
HCT: 36.7 % (ref 36.0–46.0)
Hemoglobin: 12.6 g/dL (ref 12.0–15.0)
Hemoglobin: 12.4 g/dL (ref 12.0–15.0)
MCH: 30.6 pg (ref 26.0–34.0)
MCH: 30.3 pg (ref 26.0–34.0)
MCHC: 34.3 g/dL (ref 30.0–36.0)
MCHC: 33.8 g/dL (ref 30.0–36.0)
MCV: 89.1 fL (ref 80.0–100.0)
MCV: 89.7 fL (ref 80.0–100.0)
Platelets: 333 10*3/uL (ref 150–400)
Platelets: 296 10*3/uL (ref 150–400)
RBC: 4.12 MIL/uL (ref 3.87–5.11)
RBC: 4.09 MIL/uL (ref 3.87–5.11)
RDW: 12.5 % (ref 11.5–15.5)
RDW: 12.7 % (ref 11.5–15.5)
WBC: 11 10*3/uL — ABNORMAL HIGH (ref 4.0–10.5)
WBC: 9.2 10*3/uL (ref 4.0–10.5)
nRBC: 0 % (ref 0.0–0.2)
nRBC: 0 % (ref 0.0–0.2)

## 2019-06-20 LAB — ABO/RH: ABO/RH(D): A POS

## 2019-06-20 LAB — RPR: RPR Ser Ql: NONREACTIVE

## 2019-06-20 MED ORDER — OXYCODONE-ACETAMINOPHEN 5-325 MG PO TABS: 2.0000 | ORAL_TABLET | ORAL | Status: DC | PRN

## 2019-06-20 MED ORDER — DIPHENHYDRAMINE HCL 50 MG/ML IJ SOLN: 12.5000 mg | INTRAMUSCULAR | Status: DC | PRN

## 2019-06-20 MED ORDER — OXYTOCIN 40 UNITS IN NORMAL SALINE INFUSION - SIMPLE MED
2.5000 [IU]/h | INTRAVENOUS | Status: DC
  Administered 2019-06-21: 04:00:00 39.96 [IU]/h via INTRAVENOUS
  Filled 2019-06-20: qty 1000

## 2019-06-20 MED ORDER — TERBUTALINE SULFATE 1 MG/ML IJ SOLN: 0.2500 mg | Freq: Once | INTRAMUSCULAR | Status: DC | PRN

## 2019-06-20 MED ORDER — OXYTOCIN 40 UNITS IN NORMAL SALINE INFUSION - SIMPLE MED
1.0000 m[IU]/min | INTRAVENOUS | Status: DC
  Administered 2019-06-20: 2 m[IU]/min via INTRAVENOUS
  Filled 2019-06-20: qty 1000

## 2019-06-20 MED ORDER — LIDOCAINE HCL (PF) 1 % IJ SOLN
INTRAMUSCULAR | Status: DC | PRN
  Administered 2019-06-20 (×2): 4 mL via EPIDURAL

## 2019-06-20 MED ORDER — ACETAMINOPHEN 325 MG PO TABS: 650.0000 mg | ORAL_TABLET | ORAL | Status: DC | PRN

## 2019-06-20 MED ORDER — LACTATED RINGERS IV SOLN
500.0000 mL | Freq: Once | INTRAVENOUS | Status: AC
  Administered 2019-06-20: 500 mL via INTRAVENOUS

## 2019-06-20 MED ORDER — FENTANYL-BUPIVACAINE-NACL 0.5-0.125-0.9 MG/250ML-% EP SOLN
EPIDURAL | Status: AC
  Filled 2019-06-20: qty 250

## 2019-06-20 MED ORDER — PHENYLEPHRINE 40 MCG/ML (10ML) SYRINGE FOR IV PUSH (FOR BLOOD PRESSURE SUPPORT): 80.0000 ug | PREFILLED_SYRINGE | INTRAVENOUS | Status: DC | PRN

## 2019-06-20 MED ORDER — SOD CITRATE-CITRIC ACID 500-334 MG/5ML PO SOLN: 30.0000 mL | ORAL | Status: DC | PRN

## 2019-06-20 MED ORDER — BUTORPHANOL TARTRATE 1 MG/ML IJ SOLN
1.0000 mg | INTRAMUSCULAR | Status: DC | PRN
  Administered 2019-06-20 (×2): 1 mg via INTRAVENOUS
  Filled 2019-06-20 (×2): qty 1

## 2019-06-20 MED ORDER — OXYTOCIN BOLUS FROM INFUSION
500.0000 mL | Freq: Once | INTRAVENOUS | Status: AC
  Administered 2019-06-21: 500 mL via INTRAVENOUS

## 2019-06-20 MED ORDER — OXYCODONE-ACETAMINOPHEN 5-325 MG PO TABS: 1.0000 | ORAL_TABLET | ORAL | Status: DC | PRN

## 2019-06-20 MED ORDER — EPHEDRINE 5 MG/ML INJ: 10.0000 mg | INTRAVENOUS | Status: DC | PRN

## 2019-06-20 MED ORDER — LABETALOL HCL 5 MG/ML IV SOLN: 20.0000 mg | INTRAVENOUS | Status: DC | PRN

## 2019-06-20 MED ORDER — PENICILLIN G POT IN DEXTROSE 60000 UNIT/ML IV SOLN
3.0000 10*6.[IU] | INTRAVENOUS | Status: DC
  Administered 2019-06-20 – 2019-06-21 (×6): 3 10*6.[IU] via INTRAVENOUS
  Filled 2019-06-20 (×7): qty 50

## 2019-06-20 MED ORDER — LACTATED RINGERS IV SOLN: 500.0000 mL | INTRAVENOUS | Status: DC | PRN

## 2019-06-20 MED ORDER — LACTATED RINGERS IV SOLN: 500.0000 mL | Freq: Once | INTRAVENOUS | Status: DC

## 2019-06-20 MED ORDER — LACTATED RINGERS IV SOLN: INTRAVENOUS | Status: DC

## 2019-06-20 MED ORDER — LABETALOL HCL 5 MG/ML IV SOLN: 40.0000 mg | INTRAVENOUS | Status: DC | PRN

## 2019-06-20 MED ORDER — HYDRALAZINE HCL 20 MG/ML IJ SOLN: 10.0000 mg | INTRAMUSCULAR | Status: DC | PRN

## 2019-06-20 MED ORDER — LIDOCAINE HCL (PF) 1 % IJ SOLN
30.0000 mL | INTRAMUSCULAR | Status: AC | PRN
  Administered 2019-06-21: 04:00:00 30 mL via SUBCUTANEOUS
  Filled 2019-06-20: qty 30

## 2019-06-20 MED ORDER — SODIUM CHLORIDE (PF) 0.9 % IJ SOLN
INTRAMUSCULAR | Status: DC | PRN
  Administered 2019-06-20: 12 mL/h via EPIDURAL

## 2019-06-20 MED ORDER — ONDANSETRON HCL 4 MG/2ML IJ SOLN
4.0000 mg | Freq: Four times a day (QID) | INTRAMUSCULAR | Status: DC | PRN
  Administered 2019-06-21: 01:00:00 4 mg via INTRAVENOUS
  Filled 2019-06-20: qty 2

## 2019-06-20 MED ORDER — FENTANYL-BUPIVACAINE-NACL 0.5-0.125-0.9 MG/250ML-% EP SOLN: 12.0000 mL/h | EPIDURAL | Status: DC | PRN

## 2019-06-20 MED ORDER — LABETALOL HCL 5 MG/ML IV SOLN: 80.0000 mg | INTRAVENOUS | Status: DC | PRN

## 2019-06-20 MED ORDER — SODIUM CHLORIDE 0.9 % IV SOLN
5.0000 10*6.[IU] | Freq: Once | INTRAVENOUS | Status: AC
  Administered 2019-06-20: 01:00:00 5 10*6.[IU] via INTRAVENOUS
  Filled 2019-06-20: qty 5

## 2019-06-20 MED ORDER — MISOPROSTOL 25 MCG QUARTER TABLET
25.0000 ug | ORAL_TABLET | ORAL | Status: DC | PRN
  Administered 2019-06-20 (×2): 25 ug via VAGINAL
  Filled 2019-06-20 (×2): qty 1

## 2019-06-20 NOTE — H&P (Signed)
Sonya Ali is a 29 y.o. female, G1P0, EGA 37+ weeks with EDC 3-14 presenting for induction for preeclampsia.  She was sent to [facility] last week for extended monitoring for fetal tachycardia noticed in the office.  FHT was reactive with normal baseline, but she had some BP 140/80 so PIH labs were drawn and she had UPC 3.22 so was diagnosed with preeclampsia.  Prenatal care otherwise uncomplicated.  OB History    Gravida  1   Para      Term      Preterm      AB      Living        SAB      TAB      Ectopic      Multiple      Live Births             Past Medical History:  Diagnosis Date  . ADHD   . Pregnancy induced hypertension    Past Surgical History:  Procedure Laterality Date  . 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 Social History:  reports that she has never smoked. She has never used smokeless tobacco. She reports previous alcohol use. She reports that she does not use drugs.     Maternal Diabetes: No Genetic Screening: Normal Maternal Ultrasounds/Referrals: Normal Fetal Ultrasounds or other Referrals:  None Maternal Substance Abuse:  No Significant Maternal Medications:  Meds include: Other:  Significant Maternal Lab Results:  Group B Strep positive Other Comments:  Adderall  Review of Systems  Respiratory: Negative.   Cardiovascular: Negative.    Maternal Medical History:  Contractions: Perceived severity is mild.    Fetal activity: Perceived fetal activity is normal.    Prenatal complications: Pre-eclampsia.   Prenatal Complications - Diabetes: none.   intracervical foley placed without difficulty Dilation: 1 Effacement (%): 50 Station: -3 Exam by:: Dr. Willis Modena  Blood pressure (!) 149/85, pulse 95, temperature 97.6 F (36.4 C), temperature source Oral, resp. rate 20, height 5\' 3"  (1.6 m), weight 118.4 kg, last menstrual period 09/20/2018. Maternal Exam:  Uterine  Assessment: Contraction strength is mild.  Contraction frequency is irregular.   Abdomen: Patient reports no abdominal tenderness. Estimated fetal weight is 7 lbs.   Fetal presentation: vertex  Introitus: Normal vulva. Normal vagina.  Amniotic fluid character: not assessed.  Pelvis: adequate for delivery.      Fetal Exam Fetal Monitor Review: Mode: ultrasound.   Baseline rate: 140s.  Variability: moderate (6-25 bpm).   Pattern: accelerations present and no decelerations.    Fetal State Assessment: Category I - tracings are normal.     Physical Exam  Vitals reviewed. Constitutional: She appears well-developed and well-nourished.  Cardiovascular: Normal rate and regular rhythm.  Respiratory: Effort normal. No respiratory distress.  GI: Soft.  Genitourinary:    Vulva normal.     Prenatal labs: ABO, Rh: --/--/A POS, A POS Performed at [facility], [place] [address], [place], [place] [address]  [phone]) Antibody: NEG (02/22 0049) Rubella: Immune (08/14 0000) RPR: Nonreactive (08/14 0000)  HBsAg: Negative (08/14 0000)  HIV: Non-reactive (08/14 0000)  GBS:   pos  Assessment/Plan: IUP at 37 weeks with preeclampsia, +GBS.  BP mostly 140-150/80-90, normal labs except proteinuria noted last week.  She was admitted last pm, received cytotec x 2, VE now 1 cm so intracervical foley placed and will start pitocin, monitor BP and treat prn.  Sonya Ali 06/20/2019, 8:39 AM

## 2019-06-20 NOTE — Anesthesia Procedure Notes (Signed)
Epidural Patient location during procedure: OB Start time: 06/20/2019 12:40 PM End time: 06/20/2019 12:42 PM  Staffing Anesthesiologist: Kaylyn Layer, MD Performed: anesthesiologist   Preanesthetic Checklist Completed: patient identified, IV checked, risks and benefits discussed, monitors and equipment checked, pre-op evaluation and timeout performed  Epidural Patient position: sitting Prep: DuraPrep and site prepped and draped Patient monitoring: continuous pulse ox, blood pressure and heart rate Approach: midline Location: L3-L4 Injection technique: LOR air  Needle:  Needle type: Tuohy  Needle gauge: 17 G Needle length: 9 cm Needle insertion depth: 9 cm Catheter type: closed end flexible Catheter size: 19 Gauge Catheter at skin depth: 14 cm Test dose: negative and Other (1% lidocaine)  Assessment Events: blood not aspirated, injection not painful, no injection resistance, no paresthesia and negative IV test  Additional Notes Patient identified. Risks, benefits, and alternatives discussed with patient including but not limited to bleeding, infection, nerve damage, paralysis, failed block, incomplete pain control, headache, blood pressure changes, nausea, vomiting, reactions to medication, itching, and postpartum back pain. Confirmed with bedside nurse the patient's most recent platelet count. Confirmed with patient that they are not currently taking any anticoagulation, have any bleeding history, or any family history of bleeding disorders. Patient expressed understanding and wished to proceed. All questions were answered. Sterile technique was used throughout the entire procedure. Please see nursing notes for vital signs.   Crisp LOR on first pass. Test dose was given through epidural catheter and negative prior to continuing to dose epidural or start infusion. Warning signs of high block given to the patient including shortness of breath, tingling/numbness in hands, complete  motor block, or any concerning symptoms with instructions to call for help. Patient was given instructions on fall risk and not to get out of bed. All questions and concerns addressed with instructions to call with any issues or inadequate analgesia.  Reason for block:procedure for pain

## 2019-06-20 NOTE — Anesthesia Preprocedure Evaluation (Signed)
Anesthesia Evaluation  Patient identified by MRN, date of birth, ID band Patient awake    Reviewed: Allergy & Precautions, Patient's Chart, lab work & pertinent test results  History of Anesthesia Complications Negative for: history of anesthetic complications  Airway Mallampati: II  TM Distance: >3 FB Neck ROM: Full    Dental no notable dental hx.    Pulmonary neg pulmonary ROS,    Pulmonary exam normal        Cardiovascular Normal cardiovascular exam     Neuro/Psych ADHDnegative neurological ROS     GI/Hepatic negative GI ROS, Neg liver ROS,   Endo/Other  Morbid obesity  Renal/GU negative Renal ROS  negative genitourinary   Musculoskeletal negative musculoskeletal ROS (+)   Abdominal   Peds  Hematology negative hematology ROS (+)   Anesthesia Other Findings Day of surgery medications reviewed with patient.  Reproductive/Obstetrics (+) Pregnancy PreE                             Anesthesia Physical Anesthesia Plan  ASA: III  Anesthesia Plan: Epidural   Post-op Pain Management:    Induction:   PONV Risk Score and Plan: Treatment may vary due to age or medical condition  Airway Management Planned: Natural Airway  Additional Equipment:   Intra-op Plan:   Post-operative Plan:   Informed Consent: I have reviewed the patients History and Physical, chart, labs and discussed the procedure including the risks, benefits and alternatives for the proposed anesthesia with the patient or authorized representative who has indicated his/her understanding and acceptance.       Plan Discussed with:   Anesthesia Plan Comments:         Anesthesia Quick Evaluation

## 2019-06-20 NOTE — Progress Notes (Signed)
Feeling ctx, waiting for epidural Afeb, VSS, BP 140-160/76-111 FHT- Cat I, ctx q 3 min Foley still in cervix Will get epidural, continue pitocin, will use IV labetalol if BP remains elevated

## 2019-06-20 NOTE — Progress Notes (Signed)
Comfortable with epidural, foley came out of cervix Afeb, VSS, BP improved FHT- 130-140, Cat I, ctx q 2-3 min VE-4/70/-1 to -2, vtx, AROM clear Continue pitocin, continue PCN, monitor progress and BP

## 2019-06-20 NOTE — Progress Notes (Signed)
Patient ID: Sonya Ali, female   DOB: 1991-03-16, 29 y.o.   MRN: [id number] Notified of pt with vaginal bleeding after sve for placement of cytotec.  Saw pt. She reports had moderate vaginal bleeding after exam in office recently - bled for 4 days afterwards- and per nurse moderate bleeding again today. Bright red blood noted on pad.   Per office [place] posterior placenta noted with possible accesory lobe near fundus  EFM - 135, cat 1 TOCO - ctxs q SVE - 1/thick and high  A/P: Prime at 37 1/7wks for iol- elective         Bedside US confirms office findings- post/fundal placenta with possible accessory lobe near fundus           Continue with plan for iol; next dose cytotec due at 5am

## 2019-06-21 ENCOUNTER — Encounter (HOSPITAL_COMMUNITY): Payer: Self-pay | Admitting: Obstetrics and Gynecology

## 2019-06-21 LAB — CBC
HCT: 35.9 % — ABNORMAL LOW (ref 36.0–46.0)
Hemoglobin: 12.3 g/dL (ref 12.0–15.0)
MCH: 30.7 pg (ref 26.0–34.0)
MCHC: 34.3 g/dL (ref 30.0–36.0)
MCV: 89.5 fL (ref 80.0–100.0)
Platelets: 288 10*3/uL (ref 150–400)
RBC: 4.01 MIL/uL (ref 3.87–5.11)
RDW: 12.8 % (ref 11.5–15.5)
WBC: 16.7 10*3/uL — ABNORMAL HIGH (ref 4.0–10.5)
nRBC: 0 % (ref 0.0–0.2)

## 2019-06-21 MED ORDER — PRENATAL MULTIVITAMIN CH
1.0000 | ORAL_TABLET | Freq: Every day | ORAL | Status: DC
  Administered 2019-06-21 – 2019-06-23 (×3): 1 via ORAL
  Filled 2019-06-21 (×3): qty 1

## 2019-06-21 MED ORDER — OXYCODONE HCL 5 MG PO TABS: 5.0000 mg | ORAL_TABLET | ORAL | Status: DC | PRN

## 2019-06-21 MED ORDER — COCONUT OIL OIL: 1.0000 "application " | TOPICAL_OIL | Status: DC | PRN

## 2019-06-21 MED ORDER — ZOLPIDEM TARTRATE 5 MG PO TABS: 5.0000 mg | ORAL_TABLET | Freq: Every evening | ORAL | Status: DC | PRN

## 2019-06-21 MED ORDER — SIMETHICONE 80 MG PO CHEW: 80.0000 mg | CHEWABLE_TABLET | ORAL | Status: DC | PRN

## 2019-06-21 MED ORDER — MAGNESIUM HYDROXIDE 400 MG/5ML PO SUSP: 30.0000 mL | ORAL | Status: DC | PRN

## 2019-06-21 MED ORDER — IBUPROFEN 600 MG PO TABS
600.0000 mg | ORAL_TABLET | Freq: Four times a day (QID) | ORAL | Status: DC
  Administered 2019-06-21 – 2019-06-23 (×11): 600 mg via ORAL
  Filled 2019-06-21 (×11): qty 1

## 2019-06-21 MED ORDER — TETANUS-DIPHTH-ACELL PERTUSSIS 5-2.5-18.5 LF-MCG/0.5 IM SUSP: 0.5000 mL | Freq: Once | INTRAMUSCULAR | Status: DC

## 2019-06-21 MED ORDER — ONDANSETRON HCL 4 MG/2ML IJ SOLN: 4.0000 mg | INTRAMUSCULAR | Status: DC | PRN

## 2019-06-21 MED ORDER — WITCH HAZEL-GLYCERIN EX PADS
1.0000 "application " | MEDICATED_PAD | CUTANEOUS | Status: DC | PRN
  Administered 2019-06-21: 1 via TOPICAL

## 2019-06-21 MED ORDER — OXYCODONE HCL 5 MG PO TABS: 10.0000 mg | ORAL_TABLET | ORAL | Status: DC | PRN

## 2019-06-21 MED ORDER — BENZOCAINE-MENTHOL 20-0.5 % EX AERO
1.0000 "application " | INHALATION_SPRAY | CUTANEOUS | Status: DC | PRN
  Administered 2019-06-21: 1 via TOPICAL
  Filled 2019-06-21: qty 56

## 2019-06-21 MED ORDER — DIPHENHYDRAMINE HCL 25 MG PO CAPS: 25.0000 mg | ORAL_CAPSULE | Freq: Four times a day (QID) | ORAL | Status: DC | PRN

## 2019-06-21 MED ORDER — ONDANSETRON HCL 4 MG PO TABS: 4.0000 mg | ORAL_TABLET | ORAL | Status: DC | PRN

## 2019-06-21 MED ORDER — ACETAMINOPHEN 325 MG PO TABS: 650.0000 mg | ORAL_TABLET | ORAL | Status: DC | PRN

## 2019-06-21 MED ORDER — METHYLERGONOVINE MALEATE 0.2 MG/ML IJ SOLN: 0.2000 mg | INTRAMUSCULAR | Status: DC | PRN

## 2019-06-21 MED ORDER — DIBUCAINE (PERIANAL) 1 % EX OINT
1.0000 "application " | TOPICAL_OINTMENT | CUTANEOUS | Status: DC | PRN
  Filled 2019-06-21: qty 28

## 2019-06-21 MED ORDER — MEASLES, MUMPS & RUBELLA VAC IJ SOLR: 0.5000 mL | Freq: Once | INTRAMUSCULAR | Status: DC

## 2019-06-21 MED ORDER — SENNOSIDES-DOCUSATE SODIUM 8.6-50 MG PO TABS
2.0000 | ORAL_TABLET | ORAL | Status: DC
  Administered 2019-06-22 (×2): 2 via ORAL
  Filled 2019-06-21 (×2): qty 2

## 2019-06-21 MED ORDER — METHYLERGONOVINE MALEATE 0.2 MG PO TABS: 0.2000 mg | ORAL_TABLET | ORAL | Status: DC | PRN

## 2019-06-21 NOTE — Progress Notes (Signed)
Patient ID: Sonya Ali, female   DOB: [birth year], 29 y.o.   MRN: [id number] DOD Resting comfortably.  Bleeding normal.  Some discomfort with hemorrhoid.  D/w her care

## 2019-06-21 NOTE — Lactation Note (Signed)
This note was copied from a baby's chart. Lactation Consultation Note  Patient Name: Sonya Ali [id number] Date: 06/21/2019 Reason for consult: Follow-up assessment;Primapara;1st time breastfeeding;Early term 37-38.6wks  LC in to check on Mom and baby.  ET infant at 57 hrs old, without any significant latches to the breast.  Baby's birth weight 7 lbs. 2.6 oz.  Mom holding baby STS on her chest.  Baby sleeping.    Reviewed breast massage and hand expression, drops expressed and baby placed on breast in prone position.  Baby opened her mouth a small amount but wouldn't latch.  Hand expressed colostrum and placed on baby's lips.  No rooting noted.   Talked with Mom about adding some pumping to support and stimulate milk supply.  Mom is wanting to do whatever she can.  Explained that baby is borderline at [redacted]w[redacted]d and weighs >7 lbs.  Baby will most likely awaken tonight closer to 20-24 hrs of age.  But if she doesn't, pumping would be warranted to support a full milk supply.  Plan- 1- Keep baby STS as much as possible 2-Watch baby for feeding cues, offering breast with these, asking for help with latch prn. 3- Breast massage and hand expression recommended, and spoon feeding colostrum to baby. 4- Pump both breasts 15 mins on initiation setting if baby continues to be sleepy and won't latch.  Set up DEBP at bedside, instructing on double pumping on initiation setting, disassembling parts, washing, rinsing and air drying in separate bin provided.  FOB just returned with dinner take out.  Mom will keep baby STS and eat her dinner, and then call for assistance with her first pumping.     Interventions Interventions: Breast feeding basics reviewed;Assisted with latch;Skin to skin;Breast massage;Hand express;Breast compression;Adjust position;Support pillows;Position options;Expressed milk  Lactation Tools Discussed/Used Tools: Pump Breast pump type: Double-Electric Breast Pump Pump Review:  Setup, frequency, and cleaning;Milk Storage Initiated by:: Erby Pian RN IBCLC Date initiated:: 06/21/19   Consult Status Consult Status: Follow-up Date: 06/22/19 Follow-up type: In-patient    [name] 06/21/2019, 5:58 PM

## 2019-06-21 NOTE — Lactation Note (Signed)
This note was copied from a baby's chart. Lactation Consultation Note  Patient Name: Sonya Ali Today's Date: 06/21/2019 Reason for consult: Initial assessment;Early term 37-38.6wks;Primapara;1st time breastfeeding  LC in to visit with P1 Mom of ET infant at 8 hrs old.  Baby has had a couple attempts but not latches yet.  Baby too sleepy.   Mom trying to nap, and FOB holding baby swaddled sleeping.  Mom will call out when she wakes from her sleep or baby starts cueing that she is ready to eat.  When Mom awakens, recommended she unwrap and undress baby for STS which will help to stimulate baby to feed.  Lactation brochure left in room.  LC to follow-up this afternoon.   Consult Status Consult Status: Follow-up Date: 06/21/19 Follow-up type: In-patient    [name] 06/21/2019, 12:21 PM

## 2019-06-22 LAB — CBC
HCT: 32.7 % — ABNORMAL LOW (ref 36.0–46.0)
Hemoglobin: 10.9 g/dL — ABNORMAL LOW (ref 12.0–15.0)
MCH: 30.4 pg (ref 26.0–34.0)
MCHC: 33.3 g/dL (ref 30.0–36.0)
MCV: 91.1 fL (ref 80.0–100.0)
Platelets: 270 10*3/uL (ref 150–400)
RBC: 3.59 MIL/uL — ABNORMAL LOW (ref 3.87–5.11)
RDW: 13.2 % (ref 11.5–15.5)
WBC: 10.3 10*3/uL (ref 4.0–10.5)
nRBC: 0 % (ref 0.0–0.2)

## 2019-06-22 NOTE — Anesthesia Postprocedure Evaluation (Signed)
Anesthesia Post Note  Patient: Sonya Ali  Procedure(s) Performed: AN AD HOC LABOR EPIDURAL     Patient location during evaluation: Mother Baby Anesthesia Type: Epidural Level of consciousness: awake and alert Pain management: pain level controlled Vital Signs Assessment: post-procedure vital signs reviewed and stable Respiratory status: spontaneous breathing, nonlabored ventilation and respiratory function stable Cardiovascular status: stable Postop Assessment: no headache, no backache and epidural receding Anesthetic complications: no    Last Vitals:  Vitals:   06/21/19 1932 06/22/19 0502  BP: 136/86 137/77  Pulse: 99 92  Resp: 18 18  Temp: (!) 36.4 C 36.4 C  SpO2: 100% 100%    Last Pain:  Vitals:   06/22/19 0811  TempSrc:   PainSc: 0-No pain   Pain Goal: Patients Stated Pain Goal: 8 (06/20/19 0507)                 Marrion Coy

## 2019-06-22 NOTE — Lactation Note (Addendum)
This note was copied from a baby's chart. Lactation Consultation Note  Patient Name: Girl Clessie Karras [id number] Date: 06/22/2019 Reason for consult: Follow-up assessment;Mother's request;Difficult latch;Early term 37-38.6wks P1, 24 hour ETI female infant. Mom with hx: of Raynaud's, pre-eclampsia and ADD. Per mom, infant has not been sustaining latches, she has made numerous attempts and doing a lot of STS with infant. Mom has started using DEBP and recently pumped 5 mls of colostrum. LC entered room infant was cuing to breastfeed, first mom attempted latch infant on right breast using cross cradle position, infant would not sustain latch, LC notice infant palate appears high. Mom fitted with 20 mm NS, due infant being fussy and hungry, LC pre-filled NS with 0.5 mls of colostrum mom has previously pumped earlier with DEBP. Mom latched infant on right breast using the football hold position. Infant sustained latch with 20 mm NS, and infant was given total 7 mls of colostrum with curve tip syringe while latched at breast, infant was still breastfeeding after 15 minutes when LC left room. Mom was given the breastfeeding supplemental sheet by [facility], mom understands based on infant's age/ hours of life infant can be supplemented on Day 2 ( 24-48 hours of life) with 7-12 hours of EBM per feeding. Mom knows infant can be supplement at breast or after infant latches at breast if she chooses.  Mom is aware that donor breast milk is available if needed. Mom will continue to use DEBP every 3 hours to help establish milk supply due to infant being an ETI infant, hx of difficult latch and using 20 mm NS. Mom knows to call RN or LC if she needs assistance with latching infant at breast.  Parents will continue to do STS with infant as much as possible.    Maternal Data    Feeding Feeding Type: Breast Fed  LATCH Score Latch: Grasps breast easily, tongue down, lips flanged, rhythmical sucking.  Audible  Swallowing: Spontaneous and intermittent  Type of Nipple: Everted at rest and after stimulation  Comfort (Breast/Nipple): Soft / non-tender  Hold (Positioning): Assistance needed to correctly position infant at breast and maintain latch.  LATCH Score: 9  Interventions Interventions: Skin to skin;Support pillows;Adjust position;Breast compression;Assisted with latch;Position options;DEBP;Hand express  Lactation Tools Discussed/Used Tools: Pump;Nipple Shields Nipple shield size: 20(questionable high palate) Breast pump type: Double-Electric Breast Pump   Consult Status Consult Status: Follow-up Date: 06/22/19 Follow-up type: In-patient    Danelle Earthly 06/22/2019, 4:25 AM

## 2019-06-22 NOTE — Addendum Note (Signed)
Addendum  created 06/22/19 1326 by Algis Greenhouse, CRNA   Charge Capture section accepted

## 2019-06-22 NOTE — Progress Notes (Signed)
Post Partum Day 1(PreE) Subjective: no complaints, up ad lib, voiding, tolerating PO and nl lochia, pain controlled  Objective: Blood pressure 137/77, pulse 92, temperature 97.6 F (36.4 C), temperature source Oral, resp. rate 18, height 5\' 3"  (1.6 m), weight 118.4 kg, last menstrual period 09/20/2018, SpO2 100 %, unknown if currently breastfeeding.  Physical Exam:  General: alert and no distress Lochia: appropriate Uterine Fundus: firm  Recent Labs    06/21/19 0448 06/22/19 0633  HGB 12.3 10.9*  HCT 35.9* 32.7*    Assessment/Plan: Plan for discharge tomorrow, Breastfeeding and Lactation consult.  Routine PP care Close monitoring BP through day   LOS: 2 days   Mercedez Boule Bovard-Stuckert 06/22/2019, 8:24 AM

## 2019-06-23 MED ORDER — IBUPROFEN 600 MG PO TABS: 600.0000 mg | ORAL_TABLET | Freq: Four times a day (QID) | ORAL | 1 refills | Status: DC | PRN

## 2019-06-23 NOTE — Discharge Summary (Signed)
OB Discharge Summary     Patient Name: Sonya Ali DOB: [birth year] MRN: [id number]  Date of admission: 06/20/2019 Delivering MD: Lavina Hamman   Date of discharge: 06/23/2019  Admitting diagnosis: Preeclampsia, third trimester [O14.93] Intrauterine pregnancy: [redacted]w[redacted]d     Secondary diagnosis:  Active Problems:   Preeclampsia, third trimester   SVD (spontaneous vaginal delivery)  Additional problems: none     Discharge diagnosis: Term Pregnancy Delivered and Preeclampsia (mild)                                                                                                Post partum procedures:none  Augmentation: AROM and Foley Balloon  Complications: None  Hospital course:  Induction of Labor With Vaginal Delivery   29 y.o. yo G1P1001 at [redacted]w[redacted]d was admitted to the hospital 06/20/2019 for induction of labor.  Indication for induction: Preeclampsia.  Patient had an uncomplicated labor course as follows: Membrane Rupture Time/Date: 4:54 PM ,06/20/2019   Intrapartum Procedures: Episiotomy: Median [2]                                         Lacerations:  None [1]  Patient had delivery of a Viable infant.  Information for the patient's newborn:  Etoy, Mcdonnell [[id number]]  Delivery Method: Vaginal, Spontaneous(Filed from Delivery Summary)    06/21/2019  Details of delivery can be found in separate delivery note.  Patient had a routine postpartum course. Patient is discharged home 06/23/19.  Physical exam  Vitals:   06/22/19 0502 06/22/19 1331 06/22/19 2213 06/23/19 0546  BP: 137/77 (!) 143/93 (!) 144/89 130/79  Pulse: 92 79 91 88  Resp: 18 18 18 20   Temp: 97.6 F (36.4 C) 97.7 F (36.5 C) 98.1 F (36.7 C) 97.8 F (36.6 C)  TempSrc: Oral Oral Oral   SpO2: 100%  100% 100%  Weight:      Height:       General: alert, cooperative and no distress Lochia: appropriate Uterine Fundus: firm Incision: N/A DVT Evaluation: No evidence of DVT seen on physical  exam. Labs: Lab Results  Component Value Date   WBC 10.3 06/22/2019   HGB 10.9 (L) 06/22/2019   HCT 32.7 (L) 06/22/2019   MCV 91.1 06/22/2019   PLT 270 06/22/2019   CMP Latest Ref Rng & Units 06/20/2019  Glucose 70 - 99 mg/dL 88  BUN 6 - 20 mg/dL 12  Creatinine 06/22/2019 - 3.38 mg/dL 2.50  Sodium 5.39 - 767 mmol/L 134(L)  Potassium 3.5 - 5.1 mmol/L 3.8  Chloride 98 - 111 mmol/L 106  CO2 22 - 32 mmol/L 20(L)  Calcium 8.9 - 10.3 mg/dL 9.5  Total Protein 6.5 - 8.1 g/dL 6.2(L)  Total Bilirubin 0.3 - 1.2 mg/dL 0.7  Alkaline Phos 38 - 126 U/L 153(H)  AST 15 - 41 U/L 26  ALT 0 - 44 U/L 19    Discharge instruction: per After Visit Summary and "Baby and Me Booklet".  After visit meds:  Allergies as of  06/23/2019   No Known Allergies     Medication List    TAKE these medications   acetaminophen 500 MG tablet Commonly known as: TYLENOL Take 1,000 mg by mouth every 6 (six) hours as needed for mild pain or headache.   amphetamine-dextroamphetamine 10 MG 24 hr capsule Commonly known as: ADDERALL XR Take 20 mg by mouth daily.   amphetamine-dextroamphetamine 10 MG tablet Commonly known as: ADDERALL Take 10 mg by mouth daily as needed (For ADHD).   famotidine 20 MG tablet Commonly known as: PEPCID Take 20 mg by mouth 2 (two) times daily as needed for heartburn or indigestion.   ibuprofen 600 MG tablet Commonly known as: ADVIL Take 1 tablet (600 mg total) by mouth every 6 (six) hours as needed for cramping.   prenatal multivitamin Tabs tablet Take 1 tablet by mouth daily at 12 noon.       Diet: low salt diet  Activity: Advance as tolerated. Pelvic rest for 6 weeks.   Outpatient follow up:1 week BP check and 6 weeks postpartum visit Follow up Appt:No future appointments. Follow up Visit:No follow-ups on file.  Postpartum contraception: Not Discussed  Newborn Data: Live born female  Birth Weight: 7 lb 2.6 oz (3249 g) APGAR: 8, 9  Newborn Delivery   Birth date/time:  06/21/2019 03:43:00 Delivery type: Vaginal, Spontaneous      Baby Feeding: Breast Disposition:rooming in   06/23/2019 Isaiah Serge, DO

## 2019-06-23 NOTE — Progress Notes (Addendum)
Post Partum Day 2 Subjective: up ad lib, voiding, tolerating PO, + flatus and lochia mild. Pt teary due to finding out baby may have to stay another day due to weight loss. She is pumping and producing milk. No HA, CP or SOB complaints.   Objective: Blood pressure 130/79, pulse 88, temperature 97.8 F (36.6 C), resp. rate 20, height 5\' 3"  (1.6 m), weight 118.4 kg, last menstrual period 09/20/2018, SpO2 100 %, unknown if currently breastfeeding.  Physical Exam:  General: alert, cooperative and mild distress - due to baby having to stay Lochia: appropriate Uterine Fundus: firm Incision: n/a DVT Evaluation: No significant calf/ankle edema.  Recent Labs    06/21/19 0448 06/22/19 0633  HGB 12.3 10.9*  HCT 35.9* 32.7*    Assessment/Plan: Discharge home, Breastfeeding and Lactation consult  Reassured pt - offered rooming in; FOB present and supportive Discharge instructions given Lactation consult   LOS: 3 days   06/24/19 Sonya Ali 06/23/2019, 10:04 AM

## 2019-06-23 NOTE — Lactation Note (Addendum)
This note was copied from a baby's chart. Lactation Consultation Note  Patient Name: Sonya Ali [id number] Date: 06/23/2019 Reason for consult: Follow-up assessment;Difficult latch;Primapara;1st time breastfeeding;Early term 37-38.6wks;Infant weight loss   Impression- Difficult Latch and concern about ability to transfer milk from breast.  LC in to assist with baby breastfeeding.  Baby is 57 hrs old and at 9.5% weight loss.  Baby dropped 4.8% in 24 hrs.  Baby has had 2 colostrum supplements.  MD ordered another weight at 1500.  Mom is using a 20 mm nipple shield with every feeding and has been double pumping after feeding when able.  She pumped once over night.  Mom is exhausted.    Baby positioned on right breast in football hold.  Baby fussy and with breast sandwiched she is able to attain a deep latch but doesn't stay latched.  Baby popping on and off. Digital exam noted that baby can create a suction, tongue down and cupping finger, but does rock up and down slightly.  Tongue remained down in mouth, and short posterior lingual frenulum palpated.  Palate normal.  Initiated a 20 mm nipple shield, nipple pulls well into shield.  Baby not able to maintain a suck/swallow pattern at the breast.  Tried different positions, but baby continually pushing nipple out of her mouth.    Assisted Mom with double pumping on initiation setting, 5 ml of colostrum expressed. Baby fussy and still rooting. Supplemented baby with EBM at the breast under the nipple shield using a 5 fr feeding tube and syringe. Baby settled into a nice suck/swallow pattern but her latch isn't deep.  Repeatedly pulled on baby's chin to open her mouth, she would slowly pull her lower jaw in.    Baby contented following breastfeed with 5 ml ebm supplement.  Mom to post breastfeed pump again to have EBM for supplement at next feeding.   Plan- 1- Keep baby STS as much as possible 2- Offer breast with cues, or awaken baby  at 3 hrs due to weight loss 3-Supplement baby using EBM or formula+/EBM to equal volume recommended  4-Pump both breasts 15 mins   Mom has an Avent double pump (borrowed from friend) at home.  Mom and FOB knows about the rental program out of gift shop.   Mom told that Medela Symphony DEBP would be strongest to support a milk supply.   Feeding Feeding Type: Breast Milk  LATCH Score Latch: Grasps breast easily, tongue down, lips flanged, rhythmical sucking.  Audible Swallowing: Spontaneous and intermittent(with EBM supplement flowing)  Type of Nipple: Everted at rest and after stimulation  Comfort (Breast/Nipple): Soft / non-tender  Hold (Positioning): Full assist, staff holds infant at breast  LATCH Score: 8  Interventions Interventions: Breast feeding basics reviewed;Assisted with latch;Skin to skin;Breast massage;Hand express;Pre-pump if needed;Breast compression;Adjust position;Support pillows;Position options;Expressed milk;DEBP  Lactation Tools Discussed/Used Tools: Pump;41F feeding tube / Syringe;Flanges;Nipple Shields Nipple shield size: 20 Flange Size: 21;24 Breast pump type: Double-Electric Breast Pump   Consult Status Consult Status: Follow-up Date: 06/24/19 Follow-up type: In-patient    Sonya Ali 06/23/2019, 2:24 PM

## 2019-06-23 NOTE — Discharge Instructions (Signed)
Call office with any concerns [phone] 

## 2019-06-24 ENCOUNTER — Ambulatory Visit: Payer: Self-pay

## 2019-06-24 NOTE — Lactation Note (Addendum)
This note was copied from a baby's chart. Lactation Consultation Note: Mother is a P77, infant is 24 hours old and is now at 9 % wt loss.   Mother concerned about infants weight loss. She reports that infant is bottle feeding ebm. Mother was giving infant ebm donor milk and now formula.  Mother gave 15 ml of ebm piror to [facility] visit.  Reviewed hand expression with mother. Observed large drops of colostrum. Mother assist with placing # 20 NS on .  Mothers nipples are erect with compressible breast tissue. No observed trama of mothers nipples.    Mother has a DEBP sat up at the bedside. Mother reports that pump in her room is not working properly. Staff nurse to set up a new kit and reports that pump was working properly.   Mother to follow up with [facility] services  For pre and post weight.   Plan of Care : Breastfeed infant with feeding cues Supplement infant with ebm/formula, according to supplemental guidelines. Pump using a DEBP after each feeding for 15-20 mins.  Mother will page [facility] for next feeding assistance.   Mother reports that she has a used Avent pump. Advised mother to read the reviews about pump. She is aware that she can rent a pump from the [employer].   Mother to continue to cue base feed infant and feed at least 8-12 times or more in 24 hours and advised to allow for cluster feeding infant as needed.   Mother to continue to due STS. Mother is aware of available LC services at [facility], BFSG'S, OP Dept, and phone # for questions or concerns about breastfeeding.  Mother receptive to all teaching and plan of care.    Patient Name: Sonya Ali [id number] Date: 06/24/2019 Reason for consult: Follow-up assessment   Maternal Data    Feeding Feeding Type: Bottle Fed - Breast Milk Nipple Type: Extra Slow Flow  LATCH Score                   Interventions Interventions: Hand pump  Lactation Tools Discusseeastfeeding.  Mother receptive to all teaching and plan  of care.     Consult Status      Jess Barters [facility] 06/24/2019, 9:38 AM

## 2020-06-17 ENCOUNTER — Encounter (HOSPITAL_COMMUNITY): Payer: Self-pay

## 2020-06-17 ENCOUNTER — Emergency Department (HOSPITAL_COMMUNITY): Payer: BC Managed Care – PPO

## 2020-06-17 ENCOUNTER — Emergency Department (HOSPITAL_COMMUNITY)
Admission: EM | Admit: 2020-06-17 | Discharge: 2020-06-17 | Disposition: A | Discharge status: Home / Self Care | Payer: BC Managed Care – PPO | Attending: Emergency Medicine | Admitting: Emergency Medicine

## 2020-06-17 ENCOUNTER — Other Ambulatory Visit: Payer: Self-pay

## 2020-06-17 DIAGNOSIS — M542 Cervicalgia: Secondary | ICD-10-CM | POA: Diagnosis not present

## 2020-06-17 DIAGNOSIS — R519 Headache, unspecified: Secondary | ICD-10-CM

## 2020-06-17 DIAGNOSIS — Z20822 Contact with and (suspected) exposure to covid-19: Secondary | ICD-10-CM | POA: Diagnosis not present

## 2020-06-17 DIAGNOSIS — J019 Acute sinusitis, unspecified: Secondary | ICD-10-CM | POA: Diagnosis not present

## 2020-06-17 LAB — COMPREHENSIVE METABOLIC PANEL
ALT: 19 U/L (ref 0–44)
AST: 18 U/L (ref 15–41)
Albumin: 4.6 g/dL (ref 3.5–5.0)
Alkaline Phosphatase: 88 U/L (ref 38–126)
Anion gap: 9 (ref 5–15)
BUN: 17 mg/dL (ref 6–20)
CO2: 26 mmol/L (ref 22–32)
Calcium: 9.8 mg/dL (ref 8.9–10.3)
Chloride: 107 mmol/L (ref 98–111)
Creatinine, Ser: 0.67 mg/dL (ref 0.44–1.00)
GFR, Estimated: >60 mL/min (ref >60)
Glucose, Bld: 117 mg/dL — ABNORMAL HIGH (ref 70–99)
Potassium: 4.2 mmol/L (ref 3.5–5.1)
Sodium: 142 mmol/L (ref 135–145)
Total Bilirubin: 0.8 mg/dL (ref 0.3–1.2)
Total Protein: 7.8 g/dL (ref 6.5–8.1)

## 2020-06-17 LAB — CBC WITH DIFFERENTIAL/PLATELET
Abs Immature Granulocytes: 0.02 10*3/uL (ref 0.00–0.07)
Basophils Absolute: 0 10*3/uL (ref 0.0–0.1)
Basophils Relative: 0 %
Eosinophils Absolute: 0 10*3/uL (ref 0.0–0.5)
Eosinophils Relative: 1 %
HCT: 43.1 % (ref 36.0–46.0)
Hemoglobin: 14.6 g/dL (ref 12.0–15.0)
Immature Granulocytes: 0 %
Lymphocytes Relative: 11 %
Lymphs Abs: 0.8 10*3/uL (ref 0.7–4.0)
MCH: 29.7 pg (ref 26.0–34.0)
MCHC: 33.9 g/dL (ref 30.0–36.0)
MCV: 87.6 fL (ref 80.0–100.0)
Monocytes Absolute: 0.3 10*3/uL (ref 0.1–1.0)
Monocytes Relative: 4 %
Neutro Abs: 6 10*3/uL (ref 1.7–7.7)
Neutrophils Relative %: 84 %
Platelets: 302 10*3/uL (ref 150–400)
RBC: 4.92 MIL/uL (ref 3.87–5.11)
RDW: 12.9 % (ref 11.5–15.5)
WBC: 7.1 10*3/uL (ref 4.0–10.5)
nRBC: 0 % (ref 0.0–0.2)

## 2020-06-17 LAB — RESP PANEL BY RT-PCR (FLU A&B, COVID) ARPGX2
Influenza A by PCR: NEGATIVE
Influenza B by PCR: NEGATIVE
SARS Coronavirus 2 by RT PCR: NEGATIVE

## 2020-06-17 LAB — HCG, QUANTITATIVE, PREGNANCY: hCG, Beta Chain, Quant, S: <1 m[IU]/mL (ref <5)

## 2020-06-17 MED ORDER — DIPHENHYDRAMINE HCL 50 MG/ML IJ SOLN
12.5000 mg | Freq: Once | INTRAMUSCULAR | Status: AC
  Administered 2020-06-17: 12.5 mg via INTRAVENOUS
  Filled 2020-06-17: qty 1

## 2020-06-17 MED ORDER — FLUTICASONE PROPIONATE 50 MCG/ACT NA SUSP: 2.0000 | Freq: Every day | NASAL | 0 refills | Status: DC

## 2020-06-17 MED ORDER — SODIUM CHLORIDE 0.9 % IV BOLUS
1000.0000 mL | Freq: Once | INTRAVENOUS | Status: AC
  Administered 2020-06-17: 1000 mL via INTRAVENOUS

## 2020-06-17 MED ORDER — KETOROLAC TROMETHAMINE 30 MG/ML IJ SOLN
30.0000 mg | Freq: Once | INTRAMUSCULAR | Status: AC
  Administered 2020-06-17: 30 mg via INTRAVENOUS
  Filled 2020-06-17: qty 1

## 2020-06-17 MED ORDER — AMOXICILLIN-POT CLAVULANATE 875-125 MG PO TABS: 1.0000 | ORAL_TABLET | Freq: Two times a day (BID) | ORAL | 0 refills | Status: DC

## 2020-06-17 MED ORDER — METOCLOPRAMIDE HCL 5 MG/ML IJ SOLN
5.0000 mg | Freq: Once | INTRAMUSCULAR | Status: AC
  Administered 2020-06-17: 5 mg via INTRAVENOUS
  Filled 2020-06-17: qty 2

## 2020-06-17 NOTE — ED Notes (Signed)
Visual acuity screening completed. Pt 20/30 bilaterally.

## 2020-06-17 NOTE — ED Notes (Signed)
An After Visit Summary was printed and given to the patient. Discharge instructions given and no further questions at this time.  

## 2020-06-17 NOTE — ED Provider Notes (Signed)
[facility]-EMERGENCY DEPT Provider Note   CSN: [id number] Arrival date & time: 06/17/20  1213     History Chief Complaint  Patient presents with  . Headache  . Neck Pain    Sonya Ali is a 30 y.o. female.  HPI   Pt is a 30 y/o female with a h/o ADHD, pregnancy induced HTN, who presents to the ED today for eval of a headache. States she woke up this morning with a headache. Headache started gradually this morning and got worse. States she had a h/o headaches that she used to get weekly. She usually has had tension headaches in the past. Today her headache is located diffusely but is worse to the bilat forehead. Describes pain as a throbbing pain. Rates pain as 6/10. States pain has improved since taking otc meds.   Reports her neck and shoulders are both stiff. Reports associated photophobia. She had some dizziness/lightheadedness earlier which has since improved. She reports some associated vomiting. Denies diarrhea, unilateral numbness/weakness.  She reports recent nasal congestion that started 1 week ago. Reports she has had some chills this morning but has not had any documented fevers, sore throat, body aches, cough.   States she has had her COVID vaccine and booster  Past Medical History:  Diagnosis Date  . ADHD   . Pregnancy induced hypertension     Patient Active Problem List   Diagnosis Date Noted  . SVD (spontaneous vaginal delivery) 06/21/2019  . Preeclampsia, third trimester 06/20/2019    Past Surgical History:  Procedure Laterality Date  . LEG SURGERY  2007   osteomylitis     OB History    Gravida  1   Para  1   Term  1   Preterm      AB      Living  1     SAB      IAB      Ectopic      Multiple  0   Live Births  1           Family History  Problem Relation Age of Onset  . Rheum arthritis Mother     Social History   Tobacco Use  . Smoking status: Never Smoker  . Smokeless tobacco: Never  Used  Vaping Use  . Vaping Use: Never used  Substance Use Topics  . Alcohol use: Not Currently    Comment: rarely  . Drug use: No    Home Medications Prior to Admission medications   Medication Sig Start Date End Date Taking? Authorizing Provider  acetaminophen (TYLENOL) 500 MG tablet Take 1,000 mg by mouth every 6 (six) hours as needed for mild pain or headache.   Yes [provider]  amoxicillin-clavulanate (AUGMENTIN) 875-125 MG tablet Take 1 tablet by mouth every 12 (twelve) hours. 06/17/20  Yes Navarre Diana S, PA-C  amphetamine-dextroamphetamine (ADDERALL XR) 30 MG 24 hr capsule Take 30 mg by mouth daily.   Yes [provider]  amphetamine-dextroamphetamine (ADDERALL) 20 MG tablet Take 20 mg by mouth daily.   Yes [provider]  fluticasone (FLONASE) 50 MCG/ACT nasal spray Place 2 sprays into both nostrils daily. 06/17/20  Yes Ismail Graziani S, PA-C  HEATHER 0.35 MG tablet Take 1 tablet by mouth daily. 05/31/20  Yes [provider]  Prenatal Vit-Fe Fumarate-FA (PRENATAL MULTIVITAMIN) TABS tablet Take 1 tablet by mouth daily at 12 noon.   Yes [provider]  ibuprofen (ADVIL) 600 MG tablet Take  1 tablet (600 mg total) by mouth every 6 (six) hours as needed for cramping. Patient not taking: No sig reported 06/23/19   Edwinna Areola, DO    Allergies    Patient has no known allergies.  Review of Systems   Review of Systems  Constitutional: Negative for fever.  HENT: Positive for congestion and sinus pressure. Negative for ear pain and sore throat.   Eyes: Positive for photophobia. Negative for visual disturbance.  Respiratory: Negative for cough and shortness of breath.   Cardiovascular: Negative for chest pain.  Gastrointestinal: Positive for nausea and vomiting. Negative for abdominal pain.  Genitourinary: Negative for dysuria and hematuria.  Musculoskeletal: Negative for back pain.  Skin: Negative for color change and rash.   Neurological: Positive for dizziness (resolved), light-headedness (resolved) and headaches. Negative for seizures, syncope, speech difficulty, weakness and numbness.  All other systems reviewed and are negative.   Physical Exam Updated Vital Signs BP (!) 163/92 (BP Location: Left Arm)   Pulse 94   Temp 98 F (36.7 C) (Oral)   Resp 12   LMP 06/17/2020 (Approximate)   SpO2 100%   Physical Exam Vitals and nursing note reviewed.  Constitutional:      General: She is not in acute distress.    Appearance: She is well-developed and well-nourished.  HENT:     Head: Normocephalic and atraumatic.  Eyes:     Conjunctiva/sclera: Conjunctivae normal.  Neck:     Comments: Mild ttp to the bilat cervical paraspinous muscles Cardiovascular:     Rate and Rhythm: Normal rate and regular rhythm.     Heart sounds: No murmur heard.   Pulmonary:     Effort: Pulmonary effort is normal. No respiratory distress.     Breath sounds: Normal breath sounds.  Abdominal:     Palpations: Abdomen is soft.     Tenderness: There is no abdominal tenderness.  Musculoskeletal:        General: No edema.     Cervical back: Neck supple. No rigidity.  Skin:    General: Skin is warm and dry.  Neurological:     Mental Status: She is alert.     Comments: Mental Status:  Alert, thought content appropriate, able to give a coherent history. Speech fluent without evidence of aphasia. Able to follow 2 step commands without difficulty.  Cranial Nerves:  II:  pupils equal, round, reactive to light III,IV, VI: ptosis not present, extra-ocular motions intact bilaterally  V,VII: smile symmetric, facial light touch sensation equal VIII: hearing grossly normal to voice  X: uvula elevates symmetrically  XI: bilateral shoulder shrug symmetric and strong XII: midline tongue extension without fassiculations Motor:  Normal tone. 5/5 strength of BUE and BLE major muscle groups including strong and equal grip strength and  dorsiflexion/plantar flexion Sensory: light touch normal in all extremities. Cerebellar: normal finger-to-nose with bilateral upper extremities Gait: normal gait and balance.    Psychiatric:        Mood and Affect: Mood and affect normal.     ED Results / Procedures / Treatments   Labs (all labs ordered are listed, but only abnormal results are displayed) Labs Reviewed  COMPREHENSIVE METABOLIC PANEL - Abnormal; Notable for the following components:      Result Value   Glucose, Bld 117 (*)    All other components within normal limits  RESP PANEL BY RT-PCR (FLU A&B, COVID) ARPGX2  CBC WITH DIFFERENTIAL/PLATELET  HCG, QUANTITATIVE, PREGNANCY    EKG None  Radiology  CT Head Wo Contrast  Result Date: 06/17/2020 CLINICAL DATA:  30 year old female with severe acute headache today. EXAM: CT HEAD WITHOUT CONTRAST TECHNIQUE: Contiguous axial images were obtained from the base of the skull through the vertex without intravenous contrast. COMPARISON:  None. FINDINGS: Brain: No evidence of acute infarction, hemorrhage, hydrocephalus, extra-axial collection or mass lesion/mass effect. Vascular: No hyperdense vessel or unexpected calcification. Skull: Normal. Negative for fracture or focal lesion. Sinuses/Orbits: A tiny amount of fluid is identified within the RIGHT maxillary sinus. Remainder of the sinuses are clear. Other: None. IMPRESSION: 1. No evidence of intracranial abnormality. 2. Tiny amount of fluid within the RIGHT maxillary sinus equivocal for acute sinusitis. Electronically Signed   By: Harmon Pier M.D.   On: 06/17/2020 15:53    Procedures Procedures   Medications Ordered in ED Medications  sodium chloride 0.9 % bolus 1,000 mL (0 mLs Intravenous Stopped 06/17/20 1605)  metoCLOPramide (REGLAN) injection 5 mg (5 mg Intravenous Given 06/17/20 1426)  diphenhydrAMINE (BENADRYL) injection 12.5 mg (12.5 mg Intravenous Given 06/17/20 1426)  ketorolac (TORADOL) 30 MG/ML injection 30 mg (30  mg Intravenous Given 06/17/20 1605)    ED Course  I have reviewed the triage vital signs and the nursing notes.  Pertinent labs & imaging results that were available during my care of the patient were reviewed by me and considered in my medical decision making (see chart for details).    MDM Rules/Calculators/A&P                          Pt HA treated and improved while in ED. presentation seems more consistent with a sinus headache given her associated nasal congestion and sinus pressure.  Headache is non concerning for [facility], ICH, Meningitis, or temporal arteritis.  CT scan was normal other than showing possible sinusitis.  Pt is afebrile with no focal neuro deficits, nuchal rigidity, or change in vision.  The remainder of her work-up including labs, pregnancy test and Covid tests are negative.  We will treat her for sinusitis with Flonase and Augmentin.  Pt is to follow up with PCP to discuss ED visit. Pt verbalizes understanding and is agreeable with plan to dc.   Final Clinical Impression(s) / ED Diagnoses Final diagnoses:  Acute nonintractable headache, unspecified headache type  Acute sinusitis, recurrence not specified, unspecified location    Rx / DC Orders ED Discharge Orders         Ordered    amoxicillin-clavulanate (AUGMENTIN) 875-125 MG tablet  Every 12 hours        06/17/20 1730    fluticasone (FLONASE) 50 MCG/ACT nasal spray  Daily        06/17/20 [address] [place], PA-C 06/17/20 1828    Tilden Fossa, MD 06/17/20 2317

## 2020-06-17 NOTE — ED Triage Notes (Signed)
Pt presents with c/o headache that she woke up with this morning. Pt reports a hx of migraines but reports that she is normally able to manage them with medication and rest. Pt reports this is the worst headache she has ever had and that her neck is also painful and stiff.

## 2020-06-17 NOTE — Discharge Instructions (Addendum)
You were given a prescription for antibiotics. Please take the antibiotic prescription fully.   Take flonase as directed.   Please follow up with your primary care provider within 5-7 days for re-evaluation of your symptoms. If you do not have a primary care provider, information for a healthcare clinic has been provided for you to make arrangements for follow up care. Please return to the emergency department for any new or worsening symptoms.

## 2020-06-26 LAB — HM PAP SMEAR: HM Pap smear: NORMAL

## 2021-06-27 ENCOUNTER — Ambulatory Visit (INDEPENDENT_AMBULATORY_CARE_PROVIDER_SITE_OTHER): Payer: BC Managed Care – PPO | Admitting: Family

## 2021-06-27 ENCOUNTER — Other Ambulatory Visit: Payer: Self-pay

## 2021-06-27 ENCOUNTER — Encounter: Payer: Self-pay | Admitting: Family

## 2021-06-27 VITALS — BP 120/78 | HR 109 | Temp 98.3°F | Ht 63.0 in | Wt 239.2 lb

## 2021-06-27 DIAGNOSIS — F908 Attention-deficit hyperactivity disorder, other type: Secondary | ICD-10-CM | POA: Diagnosis not present

## 2021-06-27 DIAGNOSIS — F32A Depression, unspecified: Secondary | ICD-10-CM

## 2021-06-27 DIAGNOSIS — Z3041 Encounter for surveillance of contraceptive pills: Secondary | ICD-10-CM

## 2021-06-27 DIAGNOSIS — F419 Anxiety disorder, unspecified: Secondary | ICD-10-CM | POA: Diagnosis not present

## 2021-06-27 MED ORDER — LO LOESTRIN FE 1 MG-10 MCG / 10 MCG PO TABS: 1.0000 | ORAL_TABLET | Freq: Every day | ORAL | 1 refills | Status: DC

## 2021-06-27 NOTE — Progress Notes (Signed)
? ?New Patient Office Visit ? ?Subjective:  ?Patient ID: Sonya Ali, female    DOB: 02/19/1991  Age: 31 y.o. MRN: [id number] ? ?CC:  ?Chief Complaint  ?Patient presents with  ? Establish Care  ? Headache  ?  Last month  ? Stress  ? ? ?HPI ?Sonya Ali presents for establishing care and to discuss 2 problems. ?Anxiety/Depression: Patient complains of Anxiety & Depression.   ?She has the following symptoms: feelings of losing control, palpitations, racing thoughts.  ?Onset of symptoms was approximately 3 years ago, She denies current suicidal and homicidal ideation.  ?Possible organic causes contributing are: none.  ?Risk factors: none ?Previous treatment includes Zoloft.  She complains of the following side effects from the treatment: weight gain. ?Depression screen [facility] 2/9 06/27/2021  ?Decreased Interest 1  ?Down, Depressed, Hopeless 0  ?PHQ - 2 Score 1  ?Altered sleeping 1  ?Tired, decreased energy 1  ?Change in appetite 1  ?Feeling bad or failure about yourself  0  ?Trouble concentrating 2  ?Moving slowly or fidgety/restless 0  ?Suicidal thoughts 0  ?PHQ-9 Score 6  ?Difficult doing work/chores Not difficult at all  ? ? ?Past Medical History:  ?Diagnosis Date  ? ADHD   ? Depression   ? Preeclampsia, third trimester 06/20/2019  ? Pregnancy induced hypertension   ? SVD (spontaneous vaginal delivery) 06/21/2019  ? ? ?Past Surgical History:  ?Procedure Laterality Date  ? LEG SURGERY  2007  ? osteomylitis  ? ? ?Family History  ?Problem Relation Age of Onset  ? Hypertension Mother   ? Arthritis Mother   ? Rheum arthritis Mother   ? Asthma Sister   ? Hypertension Maternal Grandmother   ? Heart disease Maternal Grandmother   ? Diabetes Maternal Grandfather   ? Hearing loss Paternal Grandmother   ? Cancer Paternal Grandmother   ? Hypertension Paternal Grandfather   ? Hyperlipidemia Paternal Grandfather   ? Diabetes Paternal Grandfather   ? Cancer Paternal Grandfather   ? ? ?Social History   ? ?Socioeconomic History  ? Marital status: Married  ?  Spouse name: Not on file  ? Number of children: Not on file  ? Years of education: Not on file  ? Highest education level: Not on file  ?Occupational History  ? Not on file  ?Tobacco Use  ? Smoking status: Never  ? Smokeless tobacco: Never  ?Vaping Use  ? Vaping Use: Never used  ?Substance and Sexual Activity  ? Alcohol use: Not Currently  ?  Comment: rarely  ? Drug use: No  ? Sexual activity: Yes  ?Other Topics Concern  ? Not on file  ?Social History Narrative  ? Not on file  ? ?Social Determinants of Health  ? ?Financial Resource Strain: Not on file  ?Food Insecurity: Not on file  ?Transportation Needs: Not on file  ?Physical Activity: Not on file  ?Stress: Not on file  ?Social Connections: Not on file  ?Intimate Partner Violence: Not on file  ? ? ?Objective:  ? ?Today's Vitals: BP 120/78   Pulse (!) 109   Temp 98.3 ?F (36.8 ?C) (Temporal)   Ht 5\' 3"  (1.6 m)   Wt 239 lb 3.2 oz (108.5 kg)   LMP  (LMP Unknown)   SpO2 99%   Breastfeeding No   BMI 42.37 kg/m?  ? ?Physical Exam ?Vitals and nursing note reviewed.  ?Constitutional:   ?   Appearance: Normal appearance. She is morbidly obese.  ?Cardiovascular:  ?  Rate and Rhythm: Normal rate and regular rhythm.  ?Pulmonary:  ?   Effort: Pulmonary effort is normal.  ?   Breath sounds: Normal breath sounds.  ?Musculoskeletal:     ?   General: Normal range of motion.  ?Skin: ?   General: Skin is warm and dry.  ?Neurological:  ?   Mental Status: She is alert.  ?Psychiatric:     ?   Mood and Affect: Mood normal.     ?   Behavior: Behavior normal.  ? ? ?Assessment & Plan:  ? ?Problem List Items Addressed This Visit   ? ?  ? Other  ? ADHD, adult residual type - Primary  ?  Chronic - Adderall ER 30 qd & 20mg  IR qd, no refills needed today, would like printed RX if possible d/t current med shortage, advised ok if pharmacy will accept, but RX can't be replaced if lost or stolen. ?  ?  ? Anxiety and depression  ?   Chronic - started mainly postpartum 2 years ago, taking Zoloft daily. ?  ?  ? Relevant Medications  ? sertraline (ZOLOFT) 50 MG tablet  ? ?Other Visit Diagnoses   ? ? Encounter for birth control pills maintenance      ? Relevant Medications  ? Norethindrone-Ethinyl Estradiol-Fe Biphas (LO LOESTRIN FE) 1 MG-10 MCG / 10 MCG tablet  ? ?  ? ? ?Outpatient Encounter Medications as of 06/27/2021  ?Medication Sig  ? amphetamine-dextroamphetamine (ADDERALL XR) 30 MG 24 hr capsule Take 30 mg by mouth daily.  ? amphetamine-dextroamphetamine (ADDERALL) 20 MG tablet Take 20 mg by mouth daily.  ? Norethindrone-Ethinyl Estradiol-Fe Biphas (LO LOESTRIN FE) 1 MG-10 MCG / 10 MCG tablet Take 1 tablet by mouth daily.  ? sertraline (ZOLOFT) 50 MG tablet 1 tablet  ? [DISCONTINUED] Norethindrone-Ethinyl Estradiol-Fe Biphas (LO LOESTRIN FE) 1 MG-10 MCG / 10 MCG tablet Take 1 tablet by mouth daily.  ? [DISCONTINUED] acetaminophen (TYLENOL) 500 MG tablet Take 1,000 mg by mouth every 6 (six) hours as needed for mild pain or headache. (Patient not taking: Reported on 06/27/2021)  ? [DISCONTINUED] amoxicillin-clavulanate (AUGMENTIN) 875-125 MG tablet Take 1 tablet by mouth every 12 (twelve) hours. (Patient not taking: Reported on 06/27/2021)  ? [DISCONTINUED] fluticasone (FLONASE) 50 MCG/ACT nasal spray Place 2 sprays into both nostrils daily. (Patient not taking: Reported on 06/27/2021)  ? [DISCONTINUED] HEATHER 0.35 MG tablet Take 1 tablet by mouth daily. (Patient not taking: Reported on 06/27/2021)  ? [DISCONTINUED] ibuprofen (ADVIL) 600 MG tablet Take 1 tablet (600 mg total) by mouth every 6 (six) hours as needed for cramping. (Patient not taking: Reported on 06/17/2020)  ? [DISCONTINUED] Prenatal Vit-Fe Fumarate-FA (PRENATAL MULTIVITAMIN) TABS tablet Take 1 tablet by mouth daily at 12 noon. (Patient not taking: Reported on 06/27/2021)  ? ?No facility-administered encounter medications on file as of 06/27/2021.  ? ? ?Follow-up: Return in about 3 months  (around 09/27/2021) for ADHD refills;  physical w/fasting labs together or separate visit..  ? ?11/27/2021, NP ?

## 2021-06-27 NOTE — Assessment & Plan Note (Signed)
Chronic - started mainly postpartum 2 years ago, taking Zoloft daily. ?

## 2021-06-27 NOTE — Patient Instructions (Addendum)
Welcome to [facility]! It was a pleasure meeting you today. ? ?As discussed, I have sent your birth control refill to your pharmacy, let me know when you need your other refills via MyChart. ? ?Please schedule a 3 month follow up visit today for continued refills.  ? ?And schedule a physical with fasting labs at your convenience! ? ? ? ?PLEASE NOTE: ? ?If you had any LAB tests please let us know if you have not heard back within a few days. You may see your results on MyChart before we have a chance to review them but we will give you a call once they are reviewed by [place]. If we ordered any REFERRALS today, please let us know if you have not heard from their office within the next week.  ?Let us know through MyChart if you are needing REFILLS, or have your pharmacy send [place] the request. You can also use MyChart to communicate with me or any office staff. ? ?Please try these tips to maintain a healthy lifestyle: ? ?Eat most of your calories during the day when you are active. Eliminate processed foods including packaged sweets (pies, cakes, cookies), reduce intake of potatoes, white bread, white pasta, and white rice. Look for whole grain options, oat flour or almond flour. ? ?Each meal should contain half fruits/vegetables, one quarter protein, and one quarter carbs (no bigger than a computer mouse). ? ?Cut down on sweet beverages. This includes juice, soda, and sweet tea. Also watch fruit intake, though this is a healthier sweet option, it still contains natural sugar! Limit to 3 servings daily. ? ?Drink at least 1 glass of water with each meal and aim for at least 8 glasses per day ? ?Exercise at least 150 minutes every week.  ? ?

## 2021-06-27 NOTE — Assessment & Plan Note (Signed)
Chronic - Adderall ER 30 qd & 20mg  IR qd, no refills needed today, would like printed RX if possible d/t current med shortage, advised ok if pharmacy will accept, but RX can't be replaced if lost or stolen. ?

## 2021-07-10 ENCOUNTER — Other Ambulatory Visit: Payer: Self-pay | Admitting: Family

## 2021-07-11 MED ORDER — AMPHETAMINE-DEXTROAMPHETAMINE 20 MG PO TABS: 20.0000 mg | ORAL_TABLET | Freq: Every day | ORAL | 0 refills | Status: DC

## 2021-07-11 MED ORDER — AMPHETAMINE-DEXTROAMPHET ER 30 MG PO CP24: 30.0000 mg | ORAL_CAPSULE | Freq: Every day | ORAL | 0 refills | Status: DC

## 2021-07-12 ENCOUNTER — Telehealth: Payer: Self-pay

## 2021-07-12 NOTE — Telephone Encounter (Signed)
I called patient to let her know her Adderall prescription is at the front desk.  ?Prescription was left with [place].  ?

## 2021-08-14 ENCOUNTER — Telehealth: Payer: Self-pay | Admitting: Family

## 2021-08-14 NOTE — Telephone Encounter (Signed)
Requesting paper copies of refill Rx ?2 prescriptions - for same medicine ? ?.. ?Encourage patient to contact the pharmacy for refills or they can request refills through [facility] ? ?LAST APPOINTMENT DATE:   ?06/27/21 ? ?NEXT APPOINTMENT DATE: ?09/27/21 ? ?MEDICATION: ?amphetamine-dextroamphetamine (ADDERALL XR) 30 MG 24 hr capsule [[id number]]  ? ?amphetamine-dextroamphetamine (ADDERALL) 20 MG tablet [[id number]]  ? ? ? ?Is the patient out of medication?  ?Has less than 2 weeks left, approximately ? ?PHARMACY: ?PT would like to pick up a paper/hard copy  ? ?Let patient know to contact pharmacy at the end of the day to make sure medication is ready. ? ?Please notify patient to allow 48-72 hours to process  ?

## 2021-08-16 MED ORDER — AMPHETAMINE-DEXTROAMPHET ER 30 MG PO CP24: 30.0000 mg | ORAL_CAPSULE | Freq: Every day | ORAL | 0 refills | Status: DC

## 2021-08-16 MED ORDER — AMPHETAMINE-DEXTROAMPHETAMINE 20 MG PO TABS: 20.0000 mg | ORAL_TABLET | Freq: Every day | ORAL | 0 refills | Status: DC

## 2021-08-16 NOTE — Telephone Encounter (Signed)
RX on your desk, thanks

## 2021-08-16 NOTE — Telephone Encounter (Signed)
Rx left at front desk. Pt will be picking it up. ?

## 2021-09-11 ENCOUNTER — Telehealth: Payer: Self-pay | Admitting: Family

## 2021-09-12 MED ORDER — AMPHETAMINE-DEXTROAMPHET ER 30 MG PO CP24: 30.0000 mg | ORAL_CAPSULE | Freq: Every day | ORAL | 0 refills | Status: DC

## 2021-09-12 NOTE — Telephone Encounter (Signed)
Per the PDMP she filled the 20mg  on 5/1 and won't be able to refill until June. I can print the 30mg  dose for her and she needs to keep her appointment on 6/2- she may want to move up to 6/1 if able. thx

## 2021-09-12 NOTE — Telephone Encounter (Signed)
Per the PDMP she filled the 20mg on 5/1 and won't be able to refill until June. I can print the 30mg dose for her and she needs to keep her appointment on 6/2- she may want to move up to 6/1 if able. thx

## 2021-09-13 NOTE — Telephone Encounter (Signed)
Pt needs a call, should be available rest of the day (05/19).  Pt is requesting if appointment date is an issue, she will need something to get her to it, She will run out Sunday night (05/21), and needs it for work on Monday (05/22).

## 2021-09-13 NOTE — Telephone Encounter (Signed)
Pt called and I let her know her prescription will be at the front desk when she comes. Sonya Ali)

## 2021-09-18 ENCOUNTER — Telehealth: Payer: Self-pay | Admitting: Family

## 2021-09-20 ENCOUNTER — Other Ambulatory Visit: Payer: Self-pay | Admitting: Family

## 2021-09-20 ENCOUNTER — Encounter: Payer: Self-pay | Admitting: Family

## 2021-09-20 DIAGNOSIS — F908 Attention-deficit hyperactivity disorder, other type: Secondary | ICD-10-CM

## 2021-09-20 MED ORDER — AMPHETAMINE-DEXTROAMPHETAMINE 20 MG PO TABS: 20.0000 mg | ORAL_TABLET | Freq: Every day | ORAL | 0 refills | Status: DC

## 2021-09-20 NOTE — Telephone Encounter (Signed)
Patient is requesting a call in regard at [phone].

## 2021-09-20 NOTE — Telephone Encounter (Signed)
sent refill to pick up on 5/30 and sent pt a MyChart message. Thanks.

## 2021-09-27 ENCOUNTER — Ambulatory Visit: Payer: BC Managed Care – PPO | Admitting: Family

## 2021-09-30 ENCOUNTER — Ambulatory Visit (INDEPENDENT_AMBULATORY_CARE_PROVIDER_SITE_OTHER): Payer: BC Managed Care – PPO | Admitting: Family

## 2021-09-30 ENCOUNTER — Encounter: Payer: Self-pay | Admitting: Family

## 2021-09-30 VITALS — BP 123/86 | HR 101 | Temp 98.3°F | Ht 63.0 in | Wt 245.8 lb

## 2021-09-30 DIAGNOSIS — F908 Attention-deficit hyperactivity disorder, other type: Secondary | ICD-10-CM | POA: Diagnosis not present

## 2021-09-30 DIAGNOSIS — G9332 Myalgic encephalomyelitis/chronic fatigue syndrome: Secondary | ICD-10-CM | POA: Insufficient documentation

## 2021-09-30 DIAGNOSIS — F988 Other specified behavioral and emotional disorders with onset usually occurring in childhood and adolescence: Secondary | ICD-10-CM | POA: Insufficient documentation

## 2021-09-30 DIAGNOSIS — G43009 Migraine without aura, not intractable, without status migrainosus: Secondary | ICD-10-CM

## 2021-09-30 DIAGNOSIS — Z6835 Body mass index (BMI) 35.0-35.9, adult: Secondary | ICD-10-CM | POA: Insufficient documentation

## 2021-09-30 DIAGNOSIS — M1990 Unspecified osteoarthritis, unspecified site: Secondary | ICD-10-CM | POA: Insufficient documentation

## 2021-09-30 DIAGNOSIS — F321 Major depressive disorder, single episode, moderate: Secondary | ICD-10-CM | POA: Insufficient documentation

## 2021-09-30 HISTORY — DX: Other specified behavioral and emotional disorders with onset usually occurring in childhood and adolescence: F98.8

## 2021-09-30 HISTORY — DX: Major depressive disorder, single episode, moderate: F32.1

## 2021-09-30 MED ORDER — AMPHETAMINE-DEXTROAMPHET ER 30 MG PO CP24: 30.0000 mg | ORAL_CAPSULE | ORAL | 0 refills | Status: DC

## 2021-09-30 MED ORDER — AMPHETAMINE-DEXTROAMPHETAMINE 20 MG PO TABS: 20.0000 mg | ORAL_TABLET | Freq: Every day | ORAL | 0 refills | Status: DC

## 2021-09-30 MED ORDER — SUMATRIPTAN SUCCINATE 50 MG PO TABS: 50.0000 mg | ORAL_TABLET | ORAL | 2 refills | Status: DC | PRN

## 2021-09-30 NOTE — Patient Instructions (Signed)
It was very nice to see you today!  I have sent your Adderall refills to [facility], call them to refill for next 3 months.   I also sent over generic Imitrex for your headaches. Take at FIRST sign of pain, along with 1-2 generic Aleve. You can repeat the Imitrex in 2 hours if needed, no more than 4 pills in 24 hours.  Take the Aleve no more than twice a day. Let me know if this is still not helping your pain.  Follow up in 3 mos either virtual or in-office.       PLEASE NOTE:  If you had any lab tests please let us know if you have not heard back within a few days. You may see your results on MyChart before we have a chance to review them but we will give you a call once they are reviewed by [place]. If we ordered any referrals today, please let us know if you have not heard from their office within the next week.

## 2021-09-30 NOTE — Assessment & Plan Note (Signed)
Chronic - printed RX refills provided today for Adderall 30mg  XR and 20mg  IR, f/u in 1months. PDMP checked and verified.

## 2021-09-30 NOTE — Assessment & Plan Note (Addendum)
Pt reports having in past, but then they resolved, denies having aura, reports nausea sometimes. Has been taking Ibuprofen, but will not get rid of completely, can take up to 1-2d to get rid of. Starting Imitrex w/generic Aleve, advised on use & SE, f/u prn.

## 2021-09-30 NOTE — Progress Notes (Signed)
Subjective:     Patient ID: Sonya Ali, female    DOB: 1991/01/05, 31 y.o.   MRN: [id number]  Chief Complaint  Patient presents with   Follow-up    ADHD   HPI: Intractable headache:  reports having about 2-3 per week. Had same HA in past, but then they resolved. Triggers now would be stress and poor sleep due to toddler waking up during night. Denies aura, sometimes has nausea. Ibuprofen lessens pain, but takes 24-48h to completely resolve.  ADHD f/u: Medications helping target goals: Adderall 30 XR, 20 IR Regimen: daily Medication side effects/concerns: none Weight: no change Sleep: denies issue Mood changes:  none Tics:  none Blood pressure, Weight, Pulse, Behavior reviewed: wnl   Assessment & Plan:   Problem List Items Addressed This Visit       Cardiovascular and Mediastinum   Migraine without aura and without status migrainosus, not intractable    Pt reports having in past, but then they resolved, denies having aura, reports nausea sometimes. Has been taking Ibuprofen, but will not get rid of completely, can take up to 1-2d to get rid of. Starting Imitrex w/generic Aleve, advised on use & SE, f/u prn.       Relevant Medications   SUMAtriptan (IMITREX) 50 MG tablet     Other   ADHD, adult residual type - Primary    Chronic - printed RX refills provided today for Adderall 30mg  XR and 20mg  IR, f/u in 53months. PDMP checked and verified.       Relevant Medications   amphetamine-dextroamphetamine (ADDERALL XR) 30 MG 24 hr capsule   amphetamine-dextroamphetamine (ADDERALL XR) 30 MG 24 hr capsule (Start on 10/30/2021)   amphetamine-dextroamphetamine (ADDERALL XR) 30 MG 24 hr capsule (Start on 11/29/2021)   amphetamine-dextroamphetamine (ADDERALL) 20 MG tablet   amphetamine-dextroamphetamine (ADDERALL) 20 MG tablet (Start on 10/30/2021)   amphetamine-dextroamphetamine (ADDERALL) 20 MG tablet (Start on 11/29/2021)    Outpatient Medications Prior to Visit   Medication Sig Dispense Refill   amphetamine-dextroamphetamine (ADDERALL XR) 30 MG 24 hr capsule Take 1 capsule (30 mg total) by mouth daily. 30 capsule 0   amphetamine-dextroamphetamine (ADDERALL) 20 MG tablet Take 1 tablet (20 mg total) by mouth daily. 30 tablet 0   Norethindrone-Ethinyl Estradiol-Fe Biphas (LO LOESTRIN FE) 1 MG-10 MCG / 10 MCG tablet Take 1 tablet by mouth daily. 90 tablet 1   sertraline (ZOLOFT) 50 MG tablet 1 tablet     No facility-administered medications prior to visit.    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  Constitutional:      Appearance: Normal appearance.  Cardiovascular:     Rate and Rhythm: Normal rate and regular rhythm.  Pulmonary:     Effort: Pulmonary effort is normal.     Breath sounds: Normal breath sounds.  Musculoskeletal:        General: Normal range of motion.  Skin:    General: Skin is warm and dry.  Neurological:     Mental Status: She is alert.  Psychiatric:  Mood and Affect: Mood normal.        Behavior: Behavior normal.    BP 123/86 (BP Location: Left Arm, Patient Position: Sitting, Cuff Size: Large)   Pulse (!) 101   Temp 98.3 F (36.8 C) (Temporal)   Ht 5\' 3"  (1.6 m)   Wt 245 lb 12.8 oz (111.5 kg)   LMP  (LMP Unknown) Comment: Not having cycles yet  SpO2 99%   BMI 43.54 kg/m  Wt Readings from Last 3 Encounters:  09/30/21 245 lb 12.8 oz (111.5 kg)  06/27/21 239 lb 3.2 oz (108.5 kg)  06/20/19 261 lb (118.4 kg)     06/22/19, NP

## 2022-01-01 ENCOUNTER — Telehealth: Payer: Self-pay | Admitting: Family

## 2022-01-01 DIAGNOSIS — F908 Attention-deficit hyperactivity disorder, other type: Secondary | ICD-10-CM

## 2022-01-01 MED ORDER — AMPHETAMINE-DEXTROAMPHET ER 30 MG PO CP24: 30.0000 mg | ORAL_CAPSULE | ORAL | 0 refills | Status: DC

## 2022-01-01 MED ORDER — AMPHETAMINE-DEXTROAMPHETAMINE 20 MG PO TABS: 20.0000 mg | ORAL_TABLET | Freq: Every day | ORAL | 0 refills | Status: DC

## 2022-01-01 NOTE — Telephone Encounter (Signed)
Patient requests the following RX be printed so that Patient can pick it up at office. Patient requests to be called at ph# [phone] when RX is ready to pick up.   LAST APPOINTMENT DATE:  Please schedule appointment if longer than 1 year 09/30/21  NEXT APPOINTMENT DATE: 01/20/22  MEDICATION:  amphetamine-dextroamphetamine (ADDERALL XR) 30 MG 24 hr capsule  Is the patient out of medication? No-running low  PHARMACY: Patient requests RX be printed and she will pick up RX  Let patient know to contact pharmacy at the end of the day to make sure medication is ready.  Please notify patient to allow 48-72 hours to process

## 2022-01-02 NOTE — Addendum Note (Signed)
Addended byDulce Sellar on: 01/02/2022 12:53 PM   Modules accepted: Orders

## 2022-01-20 ENCOUNTER — Telehealth: Payer: Self-pay | Admitting: Family

## 2022-01-20 ENCOUNTER — Ambulatory Visit (INDEPENDENT_AMBULATORY_CARE_PROVIDER_SITE_OTHER): Payer: BC Managed Care – PPO | Admitting: Family

## 2022-01-20 ENCOUNTER — Encounter: Payer: Self-pay | Admitting: Family

## 2022-01-20 VITALS — BP 126/92 | HR 94 | Temp 98.0°F | Ht 63.0 in | Wt 258.1 lb

## 2022-01-20 DIAGNOSIS — F908 Attention-deficit hyperactivity disorder, other type: Secondary | ICD-10-CM

## 2022-01-20 DIAGNOSIS — F419 Anxiety disorder, unspecified: Secondary | ICD-10-CM

## 2022-01-20 DIAGNOSIS — Z0000 Encounter for general adult medical examination without abnormal findings: Secondary | ICD-10-CM

## 2022-01-20 DIAGNOSIS — F32A Depression, unspecified: Secondary | ICD-10-CM

## 2022-01-20 DIAGNOSIS — M25571 Pain in right ankle and joints of right foot: Secondary | ICD-10-CM | POA: Diagnosis not present

## 2022-01-20 LAB — CBC WITH DIFFERENTIAL/PLATELET
Basophils Absolute: 0 10*3/uL (ref 0.0–0.1)
Basophils Relative: 0.6 % (ref 0.0–3.0)
Eosinophils Absolute: 0.2 10*3/uL (ref 0.0–0.7)
Eosinophils Relative: 2.8 % (ref 0.0–5.0)
HCT: 39.5 % (ref 36.0–46.0)
Hemoglobin: 13.6 g/dL (ref 12.0–15.0)
Lymphocytes Relative: 31.2 % (ref 12.0–46.0)
Lymphs Abs: 1.7 10*3/uL (ref 0.7–4.0)
MCHC: 34.3 g/dL (ref 30.0–36.0)
MCV: 86.4 fl (ref 78.0–100.0)
Monocytes Absolute: 0.3 10*3/uL (ref 0.1–1.0)
Monocytes Relative: 6.2 % (ref 3.0–12.0)
Neutro Abs: 3.3 10*3/uL (ref 1.4–7.7)
Neutrophils Relative %: 59.2 % (ref 43.0–77.0)
Platelets: 347 10*3/uL (ref 150.0–400.0)
RBC: 4.57 Mil/uL (ref 3.87–5.11)
RDW: 13.2 % (ref 11.5–15.5)
WBC: 5.5 10*3/uL (ref 4.0–10.5)

## 2022-01-20 LAB — COMPREHENSIVE METABOLIC PANEL
ALT: 33 U/L (ref 0–35)
AST: 28 U/L (ref 0–37)
Albumin: 4.2 g/dL (ref 3.5–5.2)
Alkaline Phosphatase: 89 U/L (ref 39–117)
BUN: 14 mg/dL (ref 6–23)
CO2: 27 mEq/L (ref 19–32)
Calcium: 9.3 mg/dL (ref 8.4–10.5)
Chloride: 105 mEq/L (ref 96–112)
Creatinine, Ser: 0.82 mg/dL (ref 0.40–1.20)
GFR: 95 mL/min (ref >60.00)
Glucose, Bld: 85 mg/dL (ref 70–99)
Potassium: 4.2 mEq/L (ref 3.5–5.1)
Sodium: 140 mEq/L (ref 135–145)
Total Bilirubin: 0.4 mg/dL (ref 0.2–1.2)
Total Protein: 7.5 g/dL (ref 6.0–8.3)

## 2022-01-20 LAB — LIPID PANEL
Cholesterol: 190 mg/dL (ref 0–200)
HDL: 56.3 mg/dL (ref >39.00)
LDL Cholesterol: 108 mg/dL — ABNORMAL HIGH (ref 0–99)
NonHDL: 133.83
Total CHOL/HDL Ratio: 3
Triglycerides: 128 mg/dL (ref 0.0–149.0)
VLDL: 25.6 mg/dL (ref 0.0–40.0)

## 2022-01-20 LAB — TSH: TSH: 1.74 u[IU]/mL (ref 0.35–5.50)

## 2022-01-20 MED ORDER — AMPHETAMINE-DEXTROAMPHETAMINE 20 MG PO TABS: 20.0000 mg | ORAL_TABLET | Freq: Every day | ORAL | 0 refills | Status: DC

## 2022-01-20 MED ORDER — AMPHETAMINE-DEXTROAMPHET ER 30 MG PO CP24: 30.0000 mg | ORAL_CAPSULE | ORAL | 0 refills | Status: DC

## 2022-01-20 MED ORDER — SERTRALINE HCL 50 MG PO TABS: 50.0000 mg | ORAL_TABLET | Freq: Every day | ORAL | 1 refills | Status: AC

## 2022-01-20 NOTE — Assessment & Plan Note (Signed)
   chronic  stable on Zoloft 50mg  qd  refilling today  f/u in 6 months

## 2022-01-20 NOTE — Progress Notes (Signed)
Phone [phone]  Subjective:   Patient is a 31 y.o. female presenting for annual physical.    Chief Complaint  Patient presents with   Annual Exam    Fasting W/ Labs   HPI: ADHD f/u: Medications helping target goals: Adderall 30 XR, 20 IR Regimen: daily Medication side effects/concerns: none Weight: no change Sleep: denies issue Mood changes:  none Tics:  none Blood pressure, Weight, Pulse, Behavior reviewed: wnl  Anxiety & Depression:  pt started Zoloft postpartum a little over 2 years ago, reports starting at 25mg  and increased to 50mg  and this dose is working for her. She is not currently doing any therapy or counseling.  Ankle pain:  reports pain on her inner right ankle directly on the bone and just below it. Reports pain at times with walking. Does not remember hx of a specific injury, but thinks she had twisted her ankle a couple of times in childhood/teenage years.   See problem oriented charting- ROS- full  review of systems was completed and negative except for 2 chronic problems in HPI above.  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   Preeclampsia, third trimester 06/20/2019   Pregnancy induced hypertension    SVD (spontaneous vaginal delivery) 06/21/2019   Patient Active Problem List   Diagnosis Date Noted   Arthritis 09/30/2021   Body mass index (BMI) 35.0-35.9, adult 09/30/2021   Chronic fatigue syndrome 09/30/2021   Migraine without aura and without status migrainosus, not intractable 09/30/2021   ADHD, adult residual type 06/27/2021   Anxiety and depression 06/27/2021   Past Surgical History:  Procedure Laterality Date   LEG SURGERY  2007   osteomylitis    Family History  Problem Relation Age of Onset   Hypertension Mother    Arthritis Mother    Rheum arthritis Mother    Asthma  Sister    Hypertension Maternal Grandmother    Heart disease Maternal Grandmother    Diabetes Maternal Grandfather    Hearing loss Paternal Grandmother    Cancer Paternal Grandmother    Hypertension Paternal Grandfather    Hyperlipidemia Paternal Grandfather    Diabetes Paternal Grandfather    Cancer Paternal Grandfather     Medications- reviewed and updated Current Outpatient Medications  Medication Sig Dispense Refill   Norethindrone-Ethinyl Estradiol-Fe Biphas (LO LOESTRIN FE) 1 MG-10 MCG / 10 MCG tablet Take 1 tablet by mouth daily. 90 tablet 1   SUMAtriptan (IMITREX) 50 MG tablet Take 1 tablet (50 mg total) by mouth every 2 (two) hours as needed for migraine. Take with 2 Naproxen (Aleve). May repeat the Sumatriptan in 2 hours if headache persists or recurs, max dose of 4 pills in 24 hours. 20 tablet 2   amphetamine-dextroamphetamine (ADDERALL XR) 30 MG 24 hr capsule Take 1 capsule (30 mg total) by mouth every morning. (Patient not taking: Reported on 01/20/2022) 30 capsule 0   amphetamine-dextroamphetamine (ADDERALL) 20 MG tablet Take 1 tablet (20 mg total) by mouth daily after lunch. 30 tablet 0   sertraline (ZOLOFT) 50 MG tablet Take 1 tablet (50 mg total) by mouth daily. 90 tablet 1   No current facility-administered medications for this visit.    Allergies-reviewed and updated No Known Allergies  Social History   Social History Narrative   Not on file    Objective:  BP (!) 126/92 (  BP Location: Left Arm, Patient Position: Sitting, Cuff Size: Large)   Pulse 94   Temp 98 F (36.7 C) (Temporal)   Ht 5\' 3"  (1.6 m)   Wt 258 lb 2 oz (117.1 kg)   LMP  (LMP Unknown)   SpO2 99%   BMI 45.72 kg/m  Physical Exam Vitals and nursing note reviewed.  Constitutional:      Appearance: Normal appearance.  HENT:     Head: Normocephalic.     Right Ear: Tympanic membrane normal.     Left Ear: Tympanic membrane normal.     Nose: Nose normal.     Mouth/Throat:     Mouth: Mucous  membranes are moist.  Eyes:     Pupils: Pupils are equal, round, and reactive to light.  Cardiovascular:     Rate and Rhythm: Normal rate and regular rhythm.  Pulmonary:     Effort: Pulmonary effort is normal.     Breath sounds: Normal breath sounds.  Musculoskeletal:        General: Normal range of motion.     Cervical back: Normal range of motion.  Lymphadenopathy:     Cervical: No cervical adenopathy.  Skin:    General: Skin is warm and dry.  Neurological:     Mental Status: She is alert.  Psychiatric:        Mood and Affect: Mood normal.        Behavior: Behavior normal.      Assessment and Plan   Health Maintenance counseling: 1. Anticipatory guidance: Patient counseled regarding regular dental exams q6 months, eye exams,  avoiding smoking and second hand smoke, limiting alcohol to 1 beverage per day, no illicit drugs.   2. Risk factor reduction:  Advised patient of need for regular exercise and diet rich with fruits and vegetables to reduce risk of heart attack and stroke. Wt Readings from Last 3 Encounters:  01/20/22 258 lb 2 oz (117.1 kg)  09/30/21 245 lb 12.8 oz (111.5 kg)  06/27/21 239 lb 3.2 oz (108.5 kg)   3. Immunizations/screenings/ancillary studies Immunization History  Administered Date(s) Administered   Influenza,inj,Quad PF,6+ Mos 02/11/2019   PFIZER(Purple Top)SARS-COV-2 Vaccination 08/06/2019, 08/27/2019, 04/07/2020   Tdap 04/13/2019   Health Maintenance Due  Topic Date Due   Hepatitis C Screening  Never done   COVID-19 Vaccine (4 - Pfizer series) 06/02/2020    4. Cervical cancer screening: 2 years 5. Skin cancer screening- advised regular sunscreen use. Denies worrisome, changing, or new skin lesions.  6. Birth control/STD check: N/A 7. Smoking associated screening: non- smoker 8. Alcohol screening: none  Problem List Items Addressed This Visit       Other   ADHD, adult residual type    chronic refilling adderall 20mg  IR today pt needs  separate f/u visit for 30mg  ER & additional IR refills f/u in 1-2 weeks      Relevant Medications   amphetamine-dextroamphetamine (ADDERALL) 20 MG tablet   Anxiety and depression    chronic stable on Zoloft 50mg  qd refilling today f/u in 6 months       Relevant Medications   sertraline (ZOLOFT) 50 MG tablet   Other Visit Diagnoses     Annual physical exam    -  Primary   Relevant Orders   Comprehensive metabolic panel   TSH   Lipid panel   CBC with Differential/Platelet   Right ankle pain, unspecified chronicity       Relevant Orders   Ambulatory referral to Podiatry  Recommended follow up:  Return in about 1 week (around 01/27/2022), or 1-2 w visit for Adderall refills, for ADHD f/u. Future Appointments  Date Time Provider Department Center  01/27/2022  4:20 PM Dulce Sellar, NP [facility]    Lab/Order associations:fasting    Dulce Sellar, NP

## 2022-01-20 NOTE — Telephone Encounter (Signed)
Pam with [facility] requests to be called at ph# [phone] for clarification on 2 RX's received 01/20/22 for Adderall (states she thinks 1 RX is supposed to be for XR)

## 2022-01-20 NOTE — Assessment & Plan Note (Signed)
   chronic  refilling adderall 20mg  IR today  pt needs separate f/u visit for 30mg  ER & additional IR refills  f/u in 1-2 weeks

## 2022-01-20 NOTE — Patient Instructions (Signed)
It was very nice to see you today!    I will review your lab results via MyChart in a few days.   I have sent your Adderall 20mg  refill to [facility] & your Zoloft refill to [facility].  Have a great week!    PLEASE NOTE:  If you had any lab tests please let us know if you have not heard back within a few days. You may see your results on MyChart before we have a chance to review them but we will give you a call once they are reviewed by [place]. If we ordered any referrals today, please let us know if you have not heard from their office within the next week.

## 2022-01-20 NOTE — Telephone Encounter (Signed)
I didn't sent the XR because she can't get until around 10/2 - and she was supposed to schedule a f/u visit in a week - but I have sent it in.

## 2022-01-26 NOTE — Progress Notes (Signed)
Your glucose, electrolytes, blood count, thyroid, liver & kidney function are all normal.  Your cholesterol numbers are all good, just your LDL (bad #) is slightly high. Try to reduce any fried foods, alcohol, nonnutritional snacks e.g. chips/cookies,pies, cakes and candies, fatty meat (red meat), high fat dairy foods:  including cheese, milk, ice cream.  Increase fruits/vegetables/fiber.   Continue or restart an exercise routine, shooting for 10min 5-7days per week.

## 2022-01-26 NOTE — Progress Notes (Unsigned)
MyChart Video Visit    Virtual Visit via Video Note   This format is felt to be most appropriate for this patient at this time. Physical exam was limited by quality of the video and audio technology used for the visit. CMA was able to get the patient set up on a video visit.  Patient location: Home. Patient and provider in visit Provider location: Office  I discussed the limitations of evaluation and management by telemedicine and the availability of in person appointments. The patient expressed understanding and agreed to proceed.  Visit Date: 01/27/2022  Today's healthcare provider: Dulce Sellar, NP     Subjective:   Patient ID: Sonya Ali, female    DOB: [birth year], 31 y.o.   MRN: [id number]  No chief complaint on file.   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 30 capsule 0   amphetamine-dextroamphetamine (ADDERALL) 20 MG tablet Take 1 tablet (20 mg total) by mouth daily after lunch. 30 tablet 0   Norethindrone-Ethinyl Estradiol-Fe Biphas (LO LOESTRIN FE) 1 MG-10 MCG / 10 MCG tablet Take 1 tablet by mouth daily. 90 tablet 1   sertraline (ZOLOFT) 50 MG tablet Take 1 tablet (50 mg total) by mouth daily. 90 tablet 1   SUMAtriptan (IMITREX) 50 MG tablet Take 1 tablet (50 mg total) by mouth every 2 (two) hours as needed for migraine. Take with 2 Naproxen (Aleve). May  repeat the Sumatriptan in 2 hours if headache persists or recurs, max dose of 4 pills in 24 hours. 20 tablet 2   No facility-administered medications prior to visit.    No Known Allergies     Objective:   Physical Exam Vitals and nursing note reviewed.  Constitutional:      General: She is not in acute distress.    Appearance: Normal appearance.  HENT:     Head: Normocephalic.  Pulmonary:     Effort: No respiratory distress.  Musculoskeletal:     Cervical back: Normal range of motion.  Skin:    General: Skin is dry.     Coloration: Skin is not pale.  Neurological:     Mental Status: She is alert and oriented to person, place, and time.  Psychiatric:        Mood and Affect: Mood normal.   LMP  (LMP Unknown)   Wt Readings from Last 3 Encounters:  01/20/22 258 lb 2 oz (117.1 kg)  09/30/21 245 lb 12.8 oz (111.5 kg)  06/27/21 239 lb 3.2 oz (108.5 kg)        I discussed the assessment and treatment plan with the patient. The patient was provided an opportunity to ask questions and all were answered. The patient agreed with the plan and demonstrated an understanding of the instructions.   The patient was advised to call back or seek an in-person evaluation if the symptoms  worsen or if the condition fails to improve as anticipated.  Jeanie Sewer, NP [place] [phone] (phone) [phone] (fax)  [place]

## 2022-01-27 ENCOUNTER — Encounter: Payer: Self-pay | Admitting: Family

## 2022-01-27 ENCOUNTER — Telehealth (INDEPENDENT_AMBULATORY_CARE_PROVIDER_SITE_OTHER): Payer: BC Managed Care – PPO | Admitting: Family

## 2022-01-27 VITALS — Ht 63.0 in | Wt 257.0 lb

## 2022-01-27 DIAGNOSIS — F908 Attention-deficit hyperactivity disorder, other type: Secondary | ICD-10-CM

## 2022-01-27 MED ORDER — AMPHETAMINE-DEXTROAMPHETAMINE 20 MG PO TABS: 20.0000 mg | ORAL_TABLET | Freq: Every day | ORAL | 0 refills | Status: DC

## 2022-01-27 MED ORDER — AMPHETAMINE-DEXTROAMPHET ER 30 MG PO CP24: 30.0000 mg | ORAL_CAPSULE | ORAL | 0 refills | Status: DC

## 2022-01-27 NOTE — Patient Instructions (Addendum)
It was very nice to see you today!   Schedule your 3 month follow up prior to 04/29/2022 so you don't run out of refills - this is a holiday period so I would schedule the last week of December!

## 2022-01-27 NOTE — Assessment & Plan Note (Signed)
.   chronic . stable on adderall 20mg  IR &  30mg  ER . refilling both today  . f/u in 3 mos.

## 2022-04-08 LAB — OB RESULTS CONSOLE ABO/RH: RH Type: POSITIVE

## 2022-04-08 LAB — OB RESULTS CONSOLE ANTIBODY SCREEN: Antibody Screen: NEGATIVE

## 2022-04-10 ENCOUNTER — Encounter: Payer: Self-pay | Admitting: *Deleted

## 2022-04-27 ENCOUNTER — Other Ambulatory Visit: Payer: Self-pay | Admitting: Family

## 2022-04-27 DIAGNOSIS — G43009 Migraine without aura, not intractable, without status migrainosus: Secondary | ICD-10-CM

## 2022-04-28 NOTE — L&D Delivery Note (Signed)
Delivery Note At 5:48 PM a viable female was delivered via Vaginal, Spontaneous (Presentation: Right Occiput Anterior).  APGAR: 5, 9; weight 8 lb 14.2 oz (4030 g).   Placenta status: Spontaneous, Intact.  Cord: 3 vessels with the following complications: Knot;Long, loose nuchal x2.  Cord pH: n/a  Anesthesia: Epidural Episiotomy: None Lacerations: 2nd degree Suture Repair: 3.0 vicryl Est. Blood Loss (mL):  175 cc  Mom to postpartum.  Baby to Couplet care / Skin to Skin.  [name] A [name] 12/03/2022, 6:21 PM

## 2022-05-22 LAB — OB RESULTS CONSOLE RUBELLA ANTIBODY, IGM: Rubella: IMMUNE

## 2022-05-22 LAB — OB RESULTS CONSOLE GC/CHLAMYDIA
Chlamydia: NEGATIVE
Neisseria Gonorrhea: NEGATIVE

## 2022-05-22 LAB — OB RESULTS CONSOLE RPR: RPR: NONREACTIVE

## 2022-05-22 LAB — OB RESULTS CONSOLE GBS: GBS: POSITIVE

## 2022-05-22 LAB — OB RESULTS CONSOLE HIV ANTIBODY (ROUTINE TESTING): HIV: NONREACTIVE

## 2022-05-22 LAB — HEPATITIS C ANTIBODY: HCV Ab: NEGATIVE

## 2022-05-22 LAB — OB RESULTS CONSOLE HEPATITIS B SURFACE ANTIGEN: Hepatitis B Surface Ag: NEGATIVE

## 2022-09-24 IMAGING — CT CT HEAD W/O CM
2 of 3 series · 14 of 47 positions shown, 17 images · non-contrast
Comparison: None.

CLINICAL DATA: 29-year-old female with severe acute headache today.

EXAM:
CT HEAD WITHOUT CONTRAST
TECHNIQUE: Contiguous axial images were obtained from the base of the skull
through the vertex without intravenous contrast.

[Series 2: head wo · axial · 0.47mm/px · z∈[-264,-129]mm · 11 of 33 slices shown, 14 images]
[im 3/33  brain]
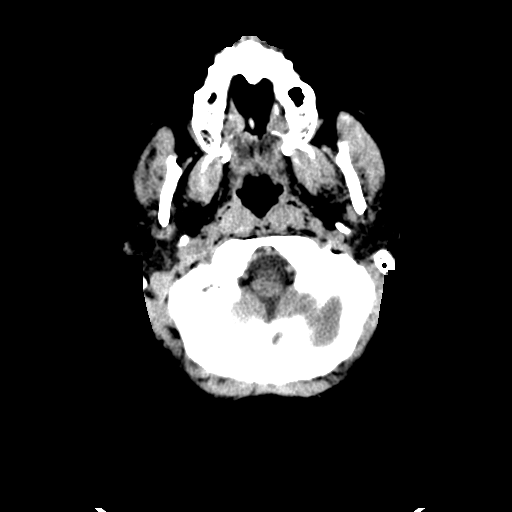
[im 3/33  bone]
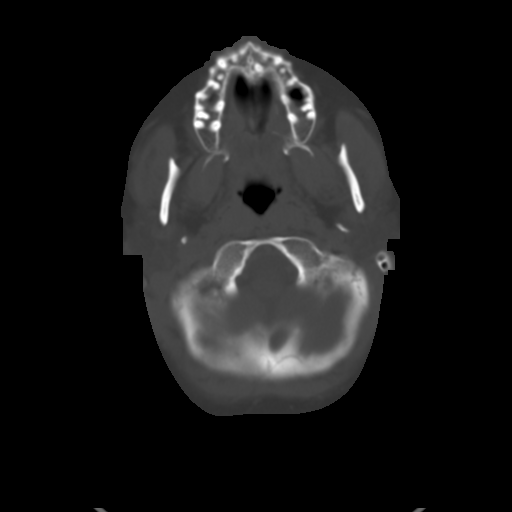
[im 5/33  brain]
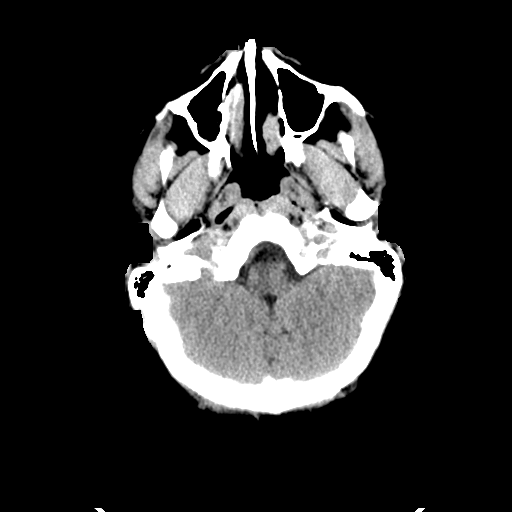
[im 8/33  brain]
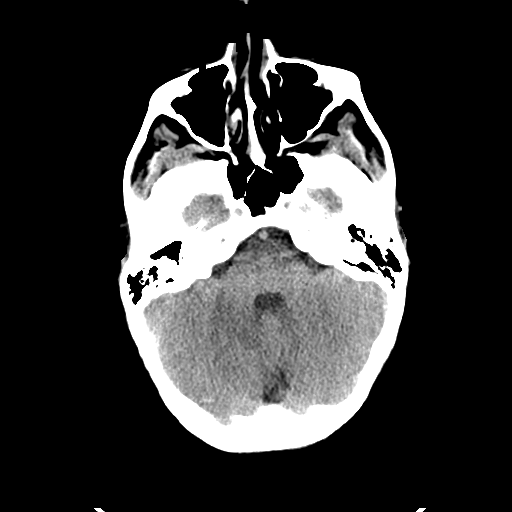
[im 10/33  brain]
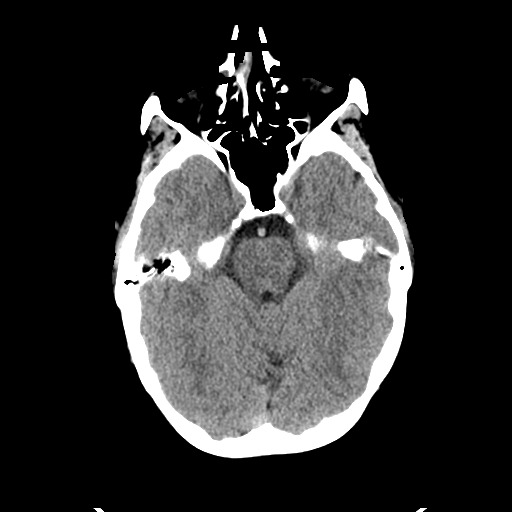
[im 14/33  brain]
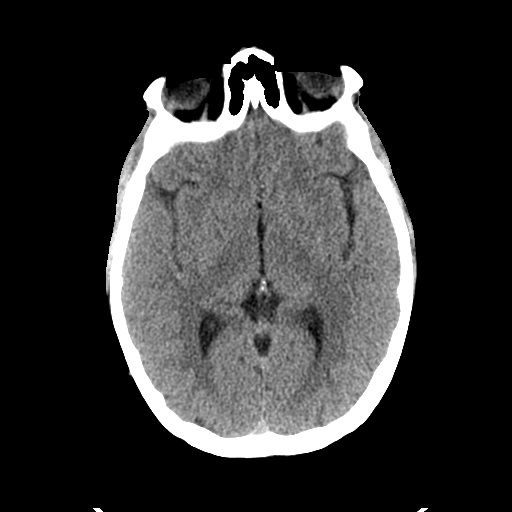
[im 14/33  bone]
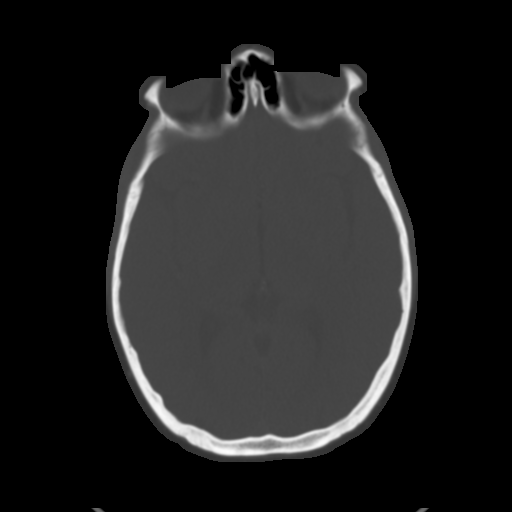
[im 17/33  brain]
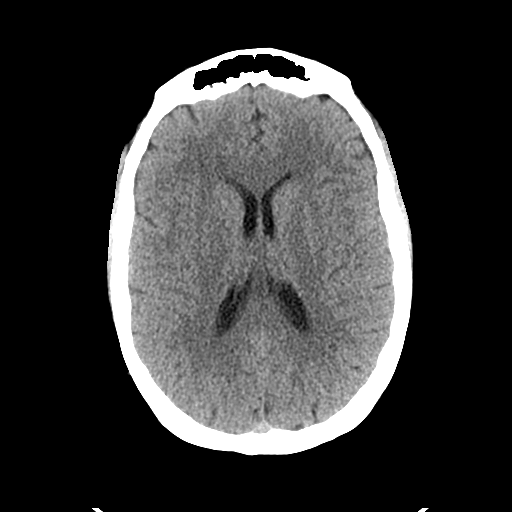
[im 19/33  brain]
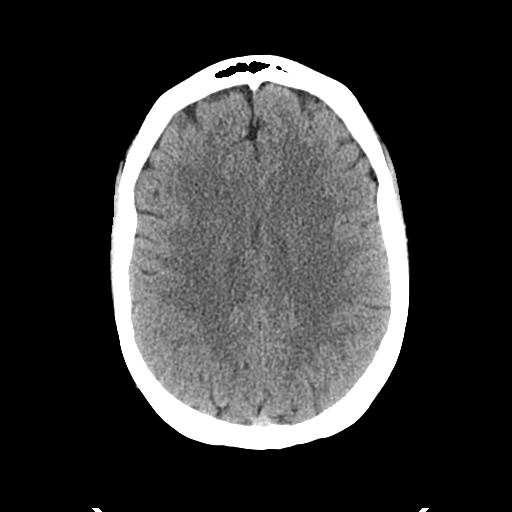
[im 23/33  brain]
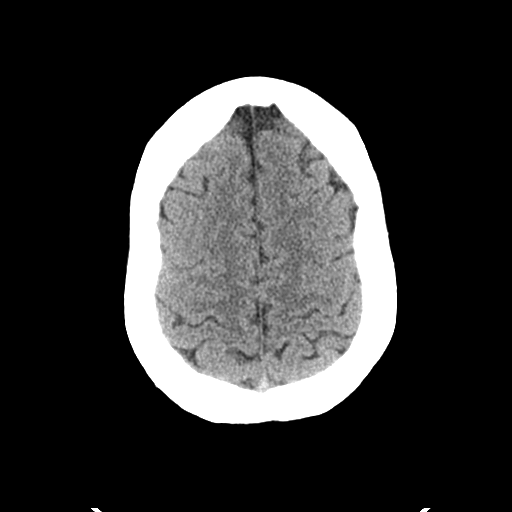
[im 25/33  brain]
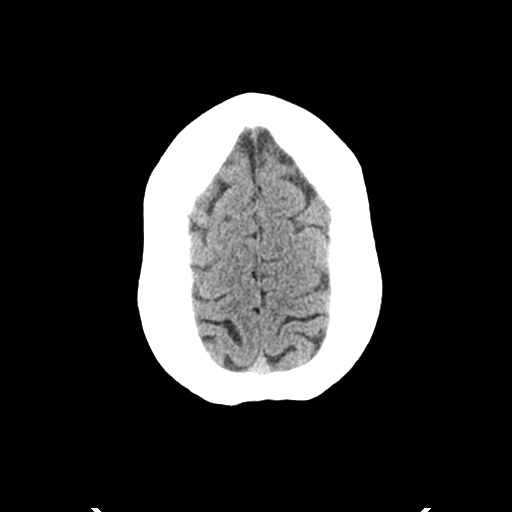
[im 25/33  bone]
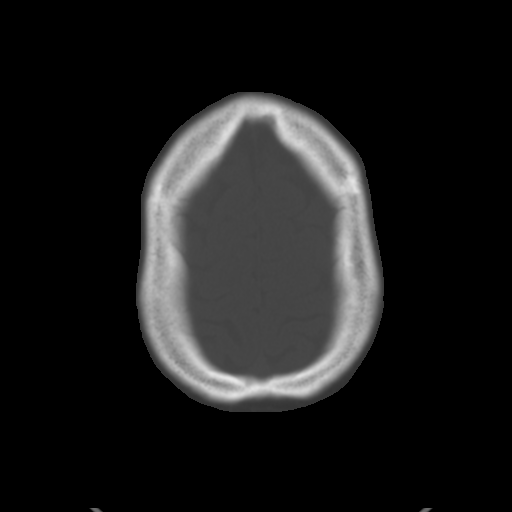
[im 28/33  brain]
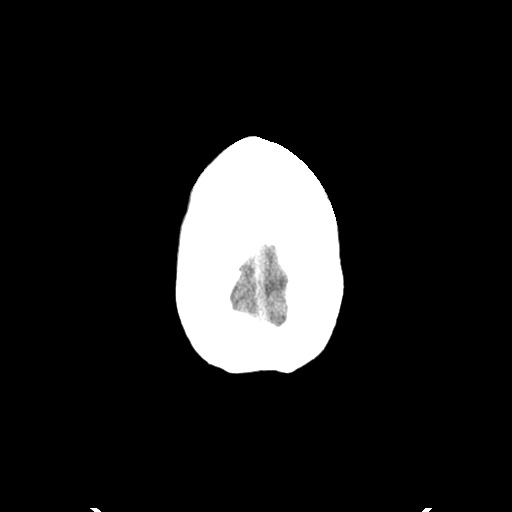
[im 30/33  brain]
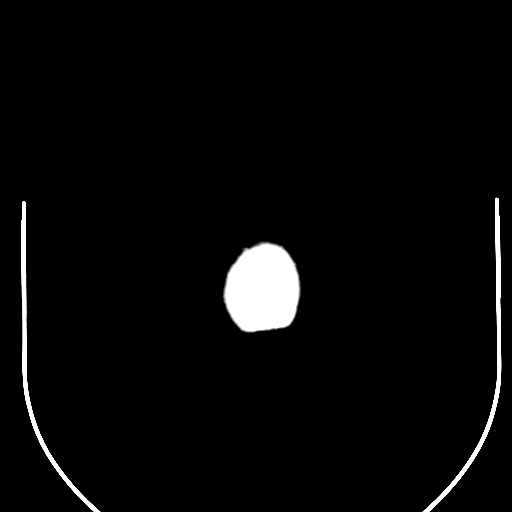

[Series 5: coronal soft tissue · coronal · 0.32mm/px · 3 of 73 slices shown]
[im 25/73  brain]
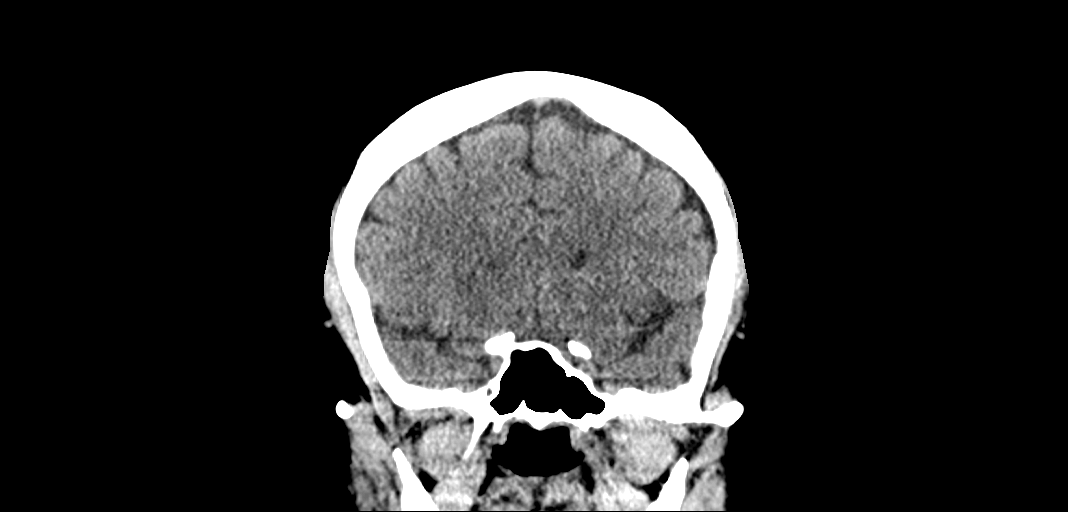
[im 33/73  brain]
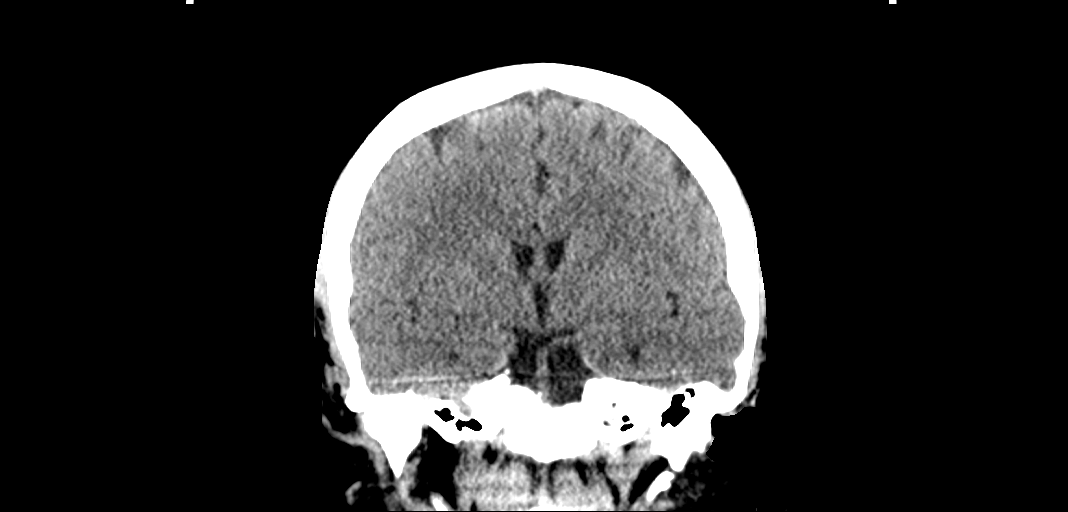
[im 41/73  brain]
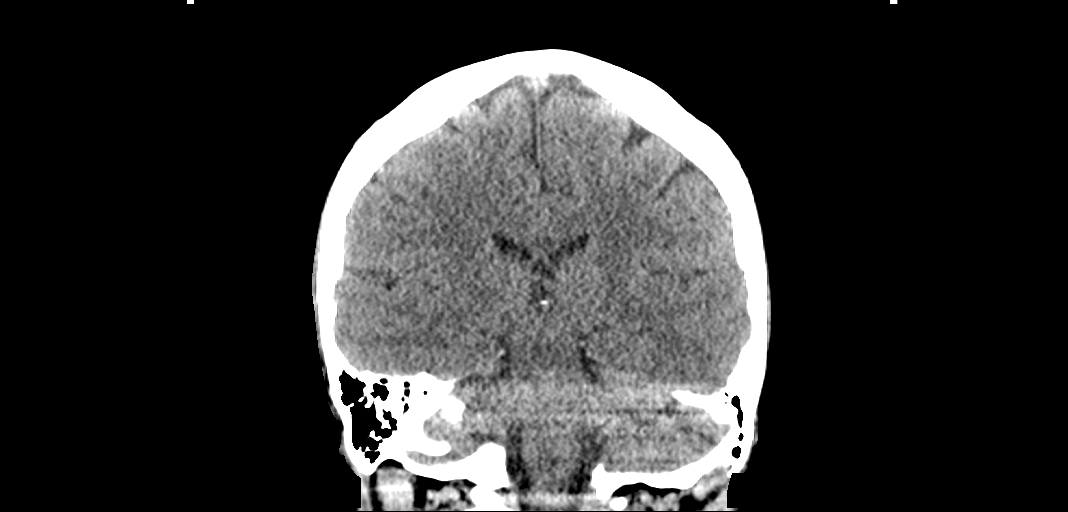

[14 of 47 positions shown; findings below may reference images not displayed]

FINDINGS: Brain: No evidence of acute infarction, hemorrhage, hydrocephalus,
extra-axial collection or mass lesion/mass effect.

Vascular: No hyperdense vessel or unexpected calcification.

Skull: Normal. Negative for fracture or focal lesion.

Sinuses/Orbits: A tiny amount of fluid is identified within the
RIGHT maxillary sinus. Remainder of the sinuses are clear.

Other: None.
IMPRESSION: 1. No evidence of intracranial abnormality.
2. Tiny amount of fluid within the RIGHT maxillary sinus equivocal
for acute sinusitis.

## 2022-09-26 ENCOUNTER — Encounter: Payer: Self-pay | Admitting: *Deleted

## 2022-11-26 ENCOUNTER — Telehealth (HOSPITAL_COMMUNITY): Payer: Self-pay | Admitting: *Deleted

## 2022-11-26 ENCOUNTER — Encounter (HOSPITAL_COMMUNITY): Payer: Self-pay | Admitting: *Deleted

## 2022-11-26 NOTE — Telephone Encounter (Signed)
Preadmission screen  

## 2022-11-27 ENCOUNTER — Encounter (HOSPITAL_COMMUNITY): Payer: Self-pay | Admitting: *Deleted

## 2022-11-28 ENCOUNTER — Other Ambulatory Visit: Payer: Self-pay | Admitting: Obstetrics and Gynecology

## 2022-12-03 ENCOUNTER — Other Ambulatory Visit: Payer: Self-pay

## 2022-12-03 ENCOUNTER — Inpatient Hospital Stay (HOSPITAL_COMMUNITY): Payer: BC Managed Care – PPO | Admitting: Anesthesiology

## 2022-12-03 ENCOUNTER — Encounter (HOSPITAL_COMMUNITY): Payer: Self-pay | Admitting: Obstetrics and Gynecology

## 2022-12-03 ENCOUNTER — Inpatient Hospital Stay (HOSPITAL_COMMUNITY): Payer: BC Managed Care – PPO

## 2022-12-03 ENCOUNTER — Inpatient Hospital Stay (HOSPITAL_COMMUNITY)
Admission: RE | Admit: 2022-12-03 | Discharge: 2022-12-04 | DRG: 806 | Disposition: A | Discharge status: Home / Self Care | Payer: BC Managed Care – PPO | Attending: Obstetrics and Gynecology | Admitting: Obstetrics and Gynecology

## 2022-12-03 DIAGNOSIS — O35EXX0 Maternal care for other (suspected) fetal abnormality and damage, fetal genitourinary anomalies, not applicable or unspecified: Secondary | ICD-10-CM | POA: Diagnosis present

## 2022-12-03 DIAGNOSIS — O99824 Streptococcus B carrier state complicating childbirth: Secondary | ICD-10-CM | POA: Diagnosis present

## 2022-12-03 DIAGNOSIS — Z3490 Encounter for supervision of normal pregnancy, unspecified, unspecified trimester: Principal | ICD-10-CM | POA: Diagnosis present

## 2022-12-03 DIAGNOSIS — O9081 Anemia of the puerperium: Secondary | ICD-10-CM | POA: Diagnosis not present

## 2022-12-03 DIAGNOSIS — D62 Acute posthemorrhagic anemia: Secondary | ICD-10-CM | POA: Diagnosis not present

## 2022-12-03 DIAGNOSIS — O99214 Obesity complicating childbirth: Principal | ICD-10-CM | POA: Diagnosis present

## 2022-12-03 DIAGNOSIS — O99344 Other mental disorders complicating childbirth: Secondary | ICD-10-CM | POA: Diagnosis present

## 2022-12-03 DIAGNOSIS — F419 Anxiety disorder, unspecified: Secondary | ICD-10-CM | POA: Diagnosis present

## 2022-12-03 DIAGNOSIS — O9902 Anemia complicating childbirth: Secondary | ICD-10-CM | POA: Diagnosis not present

## 2022-12-03 DIAGNOSIS — O26893 Other specified pregnancy related conditions, third trimester: Secondary | ICD-10-CM | POA: Diagnosis present

## 2022-12-03 DIAGNOSIS — Z3A39 39 weeks gestation of pregnancy: Secondary | ICD-10-CM | POA: Diagnosis not present

## 2022-12-03 LAB — COMPREHENSIVE METABOLIC PANEL
ALT: 17 U/L (ref 0–44)
AST: 24 U/L (ref 15–41)
Albumin: 2.5 g/dL — ABNORMAL LOW (ref 3.5–5.0)
Alkaline Phosphatase: 195 U/L — ABNORMAL HIGH (ref 38–126)
Anion gap: 12 (ref 5–15)
BUN: 11 mg/dL (ref 6–20)
CO2: 21 mmol/L — ABNORMAL LOW (ref 22–32)
Calcium: 9.1 mg/dL (ref 8.9–10.3)
Chloride: 99 mmol/L (ref 98–111)
Creatinine, Ser: 0.73 mg/dL (ref 0.44–1.00)
GFR, Estimated: >60 mL/min (ref >60)
Glucose, Bld: 82 mg/dL (ref 70–99)
Potassium: 3.9 mmol/L (ref 3.5–5.1)
Sodium: 132 mmol/L — ABNORMAL LOW (ref 135–145)
Total Bilirubin: 0.4 mg/dL (ref 0.3–1.2)
Total Protein: 6.3 g/dL — ABNORMAL LOW (ref 6.5–8.1)

## 2022-12-03 LAB — URIC ACID: Uric Acid, Serum: 4.9 mg/dL (ref 2.5–7.1)

## 2022-12-03 LAB — CBC
HCT: 34.5 % — ABNORMAL LOW (ref 36.0–46.0)
Hemoglobin: 11.1 g/dL — ABNORMAL LOW (ref 12.0–15.0)
MCH: 27.2 pg (ref 26.0–34.0)
MCHC: 32.2 g/dL (ref 30.0–36.0)
MCV: 84.6 fL (ref 80.0–100.0)
Platelets: 418 10*3/uL — ABNORMAL HIGH (ref 150–400)
RBC: 4.08 MIL/uL (ref 3.87–5.11)
RDW: 13.4 % (ref 11.5–15.5)
WBC: 11.6 10*3/uL — ABNORMAL HIGH (ref 4.0–10.5)
nRBC: 0 % (ref 0.0–0.2)

## 2022-12-03 LAB — TYPE AND SCREEN
ABO/RH(D): A POS
Antibody Screen: NEGATIVE

## 2022-12-03 LAB — PROTEIN / CREATININE RATIO, URINE
Creatinine, Urine: 31 mg/dL
Total Protein, Urine: <6 mg/dL

## 2022-12-03 LAB — RPR: RPR Ser Ql: NONREACTIVE

## 2022-12-03 LAB — LACTATE DEHYDROGENASE: LDH: 199 U/L — ABNORMAL HIGH (ref 98–192)

## 2022-12-03 MED ORDER — EPHEDRINE 5 MG/ML INJ: 10.0000 mg | INTRAVENOUS | Status: DC | PRN

## 2022-12-03 MED ORDER — SODIUM BICARBONATE 8.4 % IV SOLN
INTRAVENOUS | Status: DC | PRN
  Administered 2022-12-03: 5 mL via EPIDURAL

## 2022-12-03 MED ORDER — LACTATED RINGERS IV SOLN: INTRAVENOUS | Status: DC

## 2022-12-03 MED ORDER — ACETAMINOPHEN 325 MG PO TABS: 650.0000 mg | ORAL_TABLET | ORAL | Status: DC | PRN

## 2022-12-03 MED ORDER — ZOLPIDEM TARTRATE 5 MG PO TABS: 5.0000 mg | ORAL_TABLET | Freq: Every evening | ORAL | Status: DC | PRN

## 2022-12-03 MED ORDER — BENZOCAINE-MENTHOL 20-0.5 % EX AERO
1.0000 | INHALATION_SPRAY | CUTANEOUS | Status: DC | PRN
  Administered 2022-12-03: 1 via TOPICAL
  Filled 2022-12-03: qty 56

## 2022-12-03 MED ORDER — SENNOSIDES-DOCUSATE SODIUM 8.6-50 MG PO TABS
2.0000 | ORAL_TABLET | Freq: Every day | ORAL | Status: DC
  Administered 2022-12-04: 2 via ORAL
  Filled 2022-12-03: qty 2

## 2022-12-03 MED ORDER — OXYTOCIN-SODIUM CHLORIDE 30-0.9 UT/500ML-% IV SOLN: 2.5000 [IU]/h | INTRAVENOUS | Status: DC

## 2022-12-03 MED ORDER — ONDANSETRON HCL 4 MG/2ML IJ SOLN: 4.0000 mg | INTRAMUSCULAR | Status: DC | PRN

## 2022-12-03 MED ORDER — TERBUTALINE SULFATE 1 MG/ML IJ SOLN: 0.2500 mg | Freq: Once | INTRAMUSCULAR | Status: DC | PRN

## 2022-12-03 MED ORDER — IBUPROFEN 600 MG PO TABS
600.0000 mg | ORAL_TABLET | Freq: Four times a day (QID) | ORAL | Status: DC
  Administered 2022-12-03 – 2022-12-04 (×4): 600 mg via ORAL
  Filled 2022-12-03 (×4): qty 1

## 2022-12-03 MED ORDER — SODIUM CHLORIDE 0.9 % IV SOLN
5.0000 10*6.[IU] | Freq: Once | INTRAVENOUS | Status: AC
  Administered 2022-12-03: 5 10*6.[IU] via INTRAVENOUS
  Filled 2022-12-03: qty 5

## 2022-12-03 MED ORDER — PRENATAL MULTIVITAMIN CH
1.0000 | ORAL_TABLET | Freq: Every day | ORAL | Status: DC
  Administered 2022-12-04: 1 via ORAL
  Filled 2022-12-03: qty 1

## 2022-12-03 MED ORDER — LACTATED RINGERS IV SOLN
500.0000 mL | Freq: Once | INTRAVENOUS | Status: AC
  Administered 2022-12-03: 500 mL via INTRAVENOUS

## 2022-12-03 MED ORDER — MISOPROSTOL 25 MCG QUARTER TABLET
25.0000 ug | ORAL_TABLET | ORAL | Status: DC | PRN
  Administered 2022-12-03 (×2): 25 ug via VAGINAL
  Filled 2022-12-03 (×2): qty 1

## 2022-12-03 MED ORDER — OXYTOCIN BOLUS FROM INFUSION
333.0000 mL | Freq: Once | INTRAVENOUS | Status: AC
  Administered 2022-12-03: 333 mL via INTRAVENOUS

## 2022-12-03 MED ORDER — LIDOCAINE HCL (PF) 1 % IJ SOLN
30.0000 mL | INTRAMUSCULAR | Status: AC | PRN
  Administered 2022-12-03: 30 mL via SUBCUTANEOUS
  Filled 2022-12-03: qty 30

## 2022-12-03 MED ORDER — LACTATED RINGERS IV SOLN: 500.0000 mL | INTRAVENOUS | Status: DC | PRN

## 2022-12-03 MED ORDER — TETANUS-DIPHTH-ACELL PERTUSSIS 5-2.5-18.5 LF-MCG/0.5 IM SUSY: 0.5000 mL | PREFILLED_SYRINGE | Freq: Once | INTRAMUSCULAR | Status: DC

## 2022-12-03 MED ORDER — DIBUCAINE (PERIANAL) 1 % EX OINT: 1.0000 | TOPICAL_OINTMENT | CUTANEOUS | Status: DC | PRN

## 2022-12-03 MED ORDER — FENTANYL-BUPIVACAINE-NACL 0.5-0.125-0.9 MG/250ML-% EP SOLN
12.0000 mL/h | EPIDURAL | Status: DC | PRN
  Administered 2022-12-03: 12 mL/h via EPIDURAL
  Filled 2022-12-03: qty 250

## 2022-12-03 MED ORDER — WITCH HAZEL-GLYCERIN EX PADS: 1.0000 | MEDICATED_PAD | CUTANEOUS | Status: DC | PRN

## 2022-12-03 MED ORDER — ONDANSETRON HCL 4 MG PO TABS: 4.0000 mg | ORAL_TABLET | ORAL | Status: DC | PRN

## 2022-12-03 MED ORDER — DIPHENHYDRAMINE HCL 25 MG PO CAPS: 25.0000 mg | ORAL_CAPSULE | Freq: Four times a day (QID) | ORAL | Status: DC | PRN

## 2022-12-03 MED ORDER — COCONUT OIL OIL: 1.0000 | TOPICAL_OIL | Status: DC | PRN

## 2022-12-03 MED ORDER — PHENYLEPHRINE 80 MCG/ML (10ML) SYRINGE FOR IV PUSH (FOR BLOOD PRESSURE SUPPORT): 80.0000 ug | PREFILLED_SYRINGE | INTRAVENOUS | Status: DC | PRN

## 2022-12-03 MED ORDER — PENICILLIN G POT IN DEXTROSE 60000 UNIT/ML IV SOLN
3.0000 10*6.[IU] | INTRAVENOUS | Status: DC
  Administered 2022-12-03: 3 10*6.[IU] via INTRAVENOUS
  Filled 2022-12-03 (×2): qty 50

## 2022-12-03 MED ORDER — SOD CITRATE-CITRIC ACID 500-334 MG/5ML PO SOLN: 30.0000 mL | ORAL | Status: DC | PRN

## 2022-12-03 MED ORDER — SERTRALINE HCL 50 MG PO TABS
50.0000 mg | ORAL_TABLET | Freq: Every day | ORAL | Status: DC
  Administered 2022-12-03: 50 mg via ORAL
  Filled 2022-12-03: qty 1

## 2022-12-03 MED ORDER — OXYTOCIN-SODIUM CHLORIDE 30-0.9 UT/500ML-% IV SOLN
1.0000 m[IU]/min | INTRAVENOUS | Status: DC
  Administered 2022-12-03: 2 m[IU]/min via INTRAVENOUS
  Filled 2022-12-03: qty 500

## 2022-12-03 MED ORDER — DIPHENHYDRAMINE HCL 50 MG/ML IJ SOLN: 12.5000 mg | INTRAMUSCULAR | Status: DC | PRN

## 2022-12-03 MED ORDER — BUPIVACAINE HCL (PF) 0.25 % IJ SOLN
INTRAMUSCULAR | Status: DC | PRN
  Administered 2022-12-03: 10 mL via EPIDURAL

## 2022-12-03 MED ORDER — SIMETHICONE 80 MG PO CHEW: 80.0000 mg | CHEWABLE_TABLET | ORAL | Status: DC | PRN

## 2022-12-03 MED ORDER — ONDANSETRON HCL 4 MG/2ML IJ SOLN
4.0000 mg | Freq: Four times a day (QID) | INTRAMUSCULAR | Status: DC | PRN
  Administered 2022-12-03: 4 mg via INTRAVENOUS
  Filled 2022-12-03: qty 2

## 2022-12-03 NOTE — Lactation Note (Signed)
This note was copied from a baby's chart. Lactation Consultation Note Mom stated the baby hasn't been able to BF well. Baby's glucose is low. Mom has given some colostrum that she had hand expressed before delivery and had in little vials. LC set baby up w/pillows placing baby in football hold demonstrating baby to latch.  Baby latched on breast only if LC held breast tissue in his mouth. Breast tissue slightly compressible. Able to hand express colostrum well. Baby has irregular suck, some tongue thrusting.  Breast tissue not a candidate for NS. Recommended to pump and give colostrum. Attempt to BF but baby not able to maintain latch, then pump and bottle feed w/BM. LC recommended to use hospital DEBP, but mom declined stating she doesn't like that pump and is going to use hers. Explained to mom baby is newborn and may take a day or so to learn and be able to maintain latch. Encouraged mom to call for assistance as needed.  Patient Name: Sonya Ali Today's Date: 12/03/2022 Age:33 hours Reason for consult: Initial assessment   Maternal Data Has patient been taught Hand Expression?: Yes Does the patient have breastfeeding experience prior to this delivery?: Yes How long did the patient breastfeed?: 1 yr  Feeding    LATCH Score Latch: Repeated attempts needed to sustain latch, nipple held in mouth throughout feeding, stimulation needed to elicit sucking reflex.  Audible Swallowing: None  Type of Nipple: Everted at rest and after stimulation (short shaft,)  Comfort (Breast/Nipple): Filling, red/small blisters or bruises, mild/mod discomfort (breast heavy)  Hold (Positioning): Full assist, staff holds infant at breast  LATCH Score: 4   Lactation Tools Discussed/Used    Interventions Interventions: Breast feeding basics reviewed;Adjust position;Assisted with latch;Support pillows;Skin to skin;Position options;Education;Breast massage;Expressed milk;Hand express;LC  Services brochure;Breast compression  Discharge    Consult Status Consult Status: Follow-up Date: 12/04/22 Follow-up type: In-patient    Charyl Dancer 12/03/2022, 10:59 PM

## 2022-12-03 NOTE — Anesthesia Procedure Notes (Signed)
Epidural Patient location during procedure: OB Start time: 12/03/2022 10:58 AM End time: 12/03/2022 11:10 AM  Staffing Anesthesiologist: Mariann Barter, MD Performed: anesthesiologist   Preanesthetic Checklist Completed: patient identified, IV checked, site marked, risks and benefits discussed, surgical consent, monitors and equipment checked, pre-op evaluation and timeout performed  Epidural Patient position: sitting Prep: DuraPrep Patient monitoring: heart rate, cardiac monitor, continuous pulse ox and blood pressure Approach: midline Location: L3-L4 Injection technique: LOR saline  Needle:  Needle type: Tuohy  Needle gauge: 17 G Needle length: 9 cm Needle insertion depth: 10 cm Catheter at skin depth: 15 cm Test dose: negative and 1.5% lidocaine with Epi 1:200 K  Assessment Events: blood not aspirated, no cerebrospinal fluid, injection not painful and negative IV test  Additional Notes Reason for block:at surgeon's request and post-op pain management

## 2022-12-03 NOTE — Progress Notes (Signed)
Labor Progress Note  S/O: Pt comfortable with epidural  Vitals:   12/03/22 1600 12/03/22 1602  BP: 103/87 103/87  Pulse: 92 92  Temp: 97.9 F (36.6 C)     SVE: 9/100/-1/0 per RN exam   EFM: Cat I baseline 130 bpm mod var +accels, +early decels Toco: ctxs q 1-4 min  A/P: 31Y G2P1001 @ 39.3 IOL  -IOL: s/p cytotec, AROM at 1248 clear, currently on Pitocin and progressing well -Cont EFM/Toco -Epidural labor pain mgmt -PCN +GBS -HX of PEC, intermittent mild range elevated BP on admission, normal PIH labs, asymptomatic and no severe ranges, no meds cont to monitor -Routine intrapartum care -Anticipate SVD  Kimber Esterly A Dereon Williamsen 12/03/22 5:16 PM

## 2022-12-03 NOTE — H&P (Signed)
Sonya Ali is a 32 y.o. female G2P1001 [redacted]w[redacted]d presenting for IOL.  Pregnancy dated by Rulon Eisenmenger in setting of unknown LMP. Routine prenatal care at [facility]. Patient had routine prenatal labs WNL. Patient had low risk NIPS, negative AFP1 screen, and normal fetal anatomy scan. Normal early and routine 1hr GTT and Hgb 12.0 at third trimester screen. Pregnancy complicated by maternal BMI 46 at the start of pregnancy as well as history of PEC in her first pregnancy. Patient has been on a risk reducing bbASA. Some borderline BP's in the third trimester but no values >140/90. She had baseline PIH labs that were WNL. Followed with serial growth scans, most recent done at 36.1 showed vertex presentation with an EFW 7#3 (89%) and mild unilateral left fetal pyelectasis 7.61mm. She had weekly antenatal fetal testing with NST/BPP reassuring but persistent mild left fetal pyelectasis noted. Hx of SVD x1 pelvis proven to 7 pounds. Anxiety managed on Zoloft. +GBS on urine first trimester.   Husband for labor support. They are expecting a baby boy.   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   Preeclampsia, third trimester 06/20/2019   Pregnancy induced hypertension    SVD (spontaneous vaginal delivery) 06/21/2019   Past Surgical History:  Procedure Laterality Date   LEG SURGERY  2007   osteomylitis   Family History: family history includes Arthritis in her mother; Asthma in her sister; Cancer in her paternal grandfather and paternal grandmother; Diabetes in her maternal grandfather and paternal grandfather; Hearing loss in her paternal grandmother; Heart disease in her maternal grandmother; Hyperlipidemia in her  paternal grandfather; Hypertension in her maternal grandmother, mother, and paternal grandfather; Rheum arthritis in her mother. Social History:  reports that she has never smoked. She has never used smokeless tobacco. She reports that she does not currently use alcohol. She reports that she does not use drugs.     Maternal Diabetes: No Genetic Screening: Normal Maternal Ultrasounds/Referrals: Fetal renal pyelectasis Fetal Ultrasounds or other Referrals:  None Maternal Substance Abuse:  No Significant Maternal Medications:  Meds include: Zoloft Significant Maternal Lab Results:  Group B Strep positive Other Comments:  None  Review of Systems  All other systems reviewed and are negative.  Per HPI Maternal Exam:  Uterine Assessment: Contraction frequency is irregular.  Abdomen: Fetal presentation: vertex Introitus: Normal vulva. Normal vagina.  Pelvis: adequate for delivery.     Fetal Exam Fetal State Assessment: Category I - tracings are normal.   Physical Exam Constitutional:      Appearance: She is obese.  HENT:     Head: Normocephalic.  Cardiovascular:     Rate and Rhythm: Normal rate.  Pulmonary:     Effort: Pulmonary effort is normal.  Abdominal:     Tenderness: There is no abdominal tenderness.  Genitourinary:    General: Normal vulva.  Musculoskeletal:        General: Normal range of motion.  Cervical back: Normal range of motion.  Skin:    General: Skin is warm and dry.  Neurological:     General: No focal deficit present.     Mental Status: She is alert and oriented to person, place, and time.  Psychiatric:        Mood and Affect: Mood normal.     Dilation: 3.5 Effacement (%): 70 Station: -2 Exam by:: m wilkins rnc Blood pressure 127/68, pulse 82, temperature 98.5 F (36.9 C), temperature source Oral, height 5\' 3"  (1.6 m), weight 134.4 kg.  Prenatal labs: ABO, Rh:  --/--/A POS (08/07 0100) Antibody: NEG (08/07 0100) Rubella: Immune (01/25  0000) RPR: NON REACTIVE (08/07 0033)  HBsAg: Negative (01/25 0000)  HIV: Non-reactive (01/25 0000)  GBS: Positive/-- (01/25 0000)   Chemistry Recent Labs  Lab 12/03/22 0033  NA 132*  K 3.9  CL 99  CO2 21*  GLUCOSE 82  BUN 11  CREATININE 0.73  CALCIUM 9.1  GFRNONAA >60  ANIONGAP 12    Recent Labs  Lab 12/03/22 0033  PROT 6.3*  ALBUMIN 2.5*  AST 24  ALT 17  ALKPHOS 195*  BILITOT 0.4   Hematology Recent Labs  Lab 12/03/22 0033  WBC 11.6*  RBC 4.08  HGB 11.1*  HCT 34.5*  MCV 84.6  MCH 27.2  MCHC 32.2  RDW 13.4  PLT 418*   Cardiac EnzymesNo results for input(s): "TROPONINI" in the last 168 hours. No results for input(s): "TROPIPOC" in the last 168 hours.  BNPNo results for input(s): "BNP", "PROBNP" in the last 168 hours.  DDimer No results for input(s): "DDIMER" in the last 168 hours.   Assessment/Plan: Sonya Ali is a 32 y.o. female G2P1001 [redacted]w[redacted]d admitted for IOL  -Admission to LD -Routine admission labs -History of PEC, and borderline BP's in this pregnancy, PIH labs on admission added -IOL with cytotec, transition to Pitocin and plan for AROM -PCN for +GBS -Epidural labor pain mgmt -Mild fetal pyelectasis, for post-natal Peds f/u -Cont Zoloft for Anxiety -RH POS, Rubella Imm -Routine intrapartum care -Anticipate SVD  Emmarae Cowdery A Sheylin Scharnhorst 12/03/2022, 11:57 AM

## 2022-12-03 NOTE — Anesthesia Preprocedure Evaluation (Addendum)
Anesthesia Evaluation  Patient identified by MRN, date of birth, ID band Patient awake    Reviewed: Allergy & Precautions, NPO status , Patient's Chart, lab work & pertinent test results, reviewed documented beta blocker date and time   History of Anesthesia Complications Negative for: history of anesthetic complications  Airway Mallampati: III       Dental no notable dental hx.    Pulmonary    Pulmonary exam normal        Cardiovascular hypertension, (-) angina (-) CAD and (-) Past MI (-) dysrhythmias (-) Valvular Problems/Murmurs Rhythm:Regular Rate:Normal     Neuro/Psych  Headaches PSYCHIATRIC DISORDERS Anxiety Depression       GI/Hepatic   Endo/Other    Renal/GU      Musculoskeletal  (+) Arthritis ,    Abdominal   Peds  Hematology   Anesthesia Other Findings   Reproductive/Obstetrics                             Anesthesia Physical Anesthesia Plan  ASA: 2  Anesthesia Plan: Epidural   Post-op Pain Management:    Induction:   PONV Risk Score and Plan:   Airway Management Planned:   Additional Equipment:   Intra-op Plan:   Post-operative Plan:   Informed Consent:   Plan Discussed with:   Anesthesia Plan Comments:        Anesthesia Quick Evaluation

## 2022-12-04 DIAGNOSIS — O9902 Anemia complicating childbirth: Secondary | ICD-10-CM | POA: Diagnosis not present

## 2022-12-04 MED ORDER — POLYSACCHARIDE IRON COMPLEX 150 MG PO CAPS: 150.0000 mg | ORAL_CAPSULE | ORAL | Status: AC

## 2022-12-04 MED ORDER — BENZOCAINE-MENTHOL 20-0.5 % EX AERO: 1.0000 | INHALATION_SPRAY | CUTANEOUS | Status: AC | PRN

## 2022-12-04 MED ORDER — POLYSACCHARIDE IRON COMPLEX 150 MG PO CAPS
150.0000 mg | ORAL_CAPSULE | ORAL | Status: DC
  Administered 2022-12-04: 150 mg via ORAL
  Filled 2022-12-04: qty 1

## 2022-12-04 MED ORDER — ACETAMINOPHEN 325 MG PO TABS: 650.0000 mg | ORAL_TABLET | ORAL | Status: AC | PRN

## 2022-12-04 MED ORDER — MAGNESIUM OXIDE -MG SUPPLEMENT 400 (240 MG) MG PO TABS
400.0000 mg | ORAL_TABLET | Freq: Every day | ORAL | Status: DC
  Administered 2022-12-04: 400 mg via ORAL
  Filled 2022-12-04: qty 1

## 2022-12-04 MED ORDER — MAGNESIUM OXIDE -MG SUPPLEMENT 400 (240 MG) MG PO TABS: 400.0000 mg | ORAL_TABLET | Freq: Every day | ORAL | Status: AC

## 2022-12-04 MED ORDER — IBUPROFEN 600 MG PO TABS: 600.0000 mg | ORAL_TABLET | Freq: Four times a day (QID) | ORAL | 0 refills | Status: AC

## 2022-12-04 MED ORDER — COCONUT OIL OIL: 1.0000 | TOPICAL_OIL | Status: AC | PRN

## 2022-12-04 NOTE — Anesthesia Postprocedure Evaluation (Signed)
Anesthesia Post Note  Patient: Sonya Ali  Procedure(s) Performed: AN AD HOC LABOR EPIDURAL     Patient location during evaluation: Mother Baby Anesthesia Type: Epidural Level of consciousness: awake and alert Pain management: pain level controlled Vital Signs Assessment: post-procedure vital signs reviewed and stable Respiratory status: spontaneous breathing, nonlabored ventilation and respiratory function stable Cardiovascular status: stable Postop Assessment: no headache, no backache and epidural receding Anesthetic complications: no   No notable events documented.  Last Vitals:  Vitals:   12/04/22 0052 12/04/22 0522  BP: 111/65 124/76  Pulse: 86 85  Resp: 18 17  Temp: 36.5 C 36.6 C  SpO2: 98% 99%    Last Pain:  Vitals:   12/04/22 0522  TempSrc: Oral  PainSc: 5    Pain Goal:                   [name]

## 2022-12-04 NOTE — Progress Notes (Signed)
MOB was referred for history of depression/anxiety.  * Referral screened out by Clinical Social Worker because none of the following criteria appear to apply:  ~ History of anxiety/depression during this pregnancy, or of post-partum depression following prior delivery.  ~ Diagnosis of anxiety and/or depression within last 3 years  OR  * MOB's symptoms currently being treated with medication and/or therapy.  Per OB notes, MOB is prescribed 50mg  of Zoloft for support of symptoms.  Please contact the Clinical Social Worker if needs arise, by [facility] request, or if MOB scores greater than 9/yes to question 10 on [place] Postpartum Depression Screen.  Enos Fling, Theresia Majors Clinical Social Worker [phone]

## 2022-12-04 NOTE — Discharge Instructions (Signed)
Lactation outpatient support - home visit   Jessica Bowers, IBCLC (lactation consultant)  & Birth Doula  Phone (text or call): [phone] Email: [email or web address]   Linda Coppola RN, MHA, IBCLC at [facility]: Lactation Consultant  [email or web address] Mail: [email or web address] Tel: [phone]   Additional breastfeeding resources:  [facility] https://ibconline.ca/information-sheets/  [employer] [place]  [email or web address]   Other Resources:  Chiropractic specialist   Dr. Leanna Hastings [email or web address]   Craniosacral therapy for baby  Erin Balkind  [email or web address]  Pediatric Dentist (for tongue ties)  Dr. Kate Lambert Spangler, [facility]  Phone: [phone]  [address] [place]  [address] [email or web address]  

## 2022-12-04 NOTE — Lactation Note (Signed)
This note was copied from a baby's chart. Lactation Consultation Note  Patient Name: Sonya Ali Date: 12/04/2022 Age:33 hours Reason for consult: Follow-up assessment (weight loss -1.99%, Birth Parent is breastfeeding and supplementing with formula and EBM.) Per Birth Parent, infant is latching well most feedings are 10 to 15 minutes in length and she brought 16 mls that she had pre-pumped in  pregnancy. Birth Parent is using her EBM and formula after latching infant at the breast ( her choice). Per Birth Parent,with her 1st child she pumped only and had oversupply, she informed LC she still has EBM in fridge. LC discussed do not  previously pumped milk from 88 year old child to new infant due to , EBM expiring, her  EBM is only good for 1 year in the Fridge, hand out given "Storage and Preparation of Breast Milk." LC suggested she can make lotion and use for soak baths  with old EBM from first child , but do not give it to newborn. Birth Parent knows her EBM is safe for 4 hours whereas formula must be used within 1 hour once open. LC did not observe latch due to infant recently BF prior to [facility] entering the room.   Current Feeding Plan: 1- Birth Parent will continue to BF infant by cues, on demand, 8 to 12+ times within 24 hours, skin to skin, if latch assistance is needed to call RN/LC. 2- Parent's choice she will  continue to supplement infant after latching infant at the breast. Day 2 will offer (7-12 mls) per feeding. 3- Birth Parent will continue to use her Personal DEBP, and give infant back her EBM first before offering formula.    Maternal Data    Feeding Mother's Current Feeding Choice: Breast Milk and Formula Nipple Type: Slow - flow  LATCH Score                    Lactation Tools Discussed/Used    Interventions Interventions: Education;Skin to skin;Position options;Pace feeding;LC Services brochure;DEBP  Discharge    Consult Status Consult  Status: Follow-up Date: 12/05/22 Follow-up type: In-patient    Sonya Ali 12/04/2022, 6:40 PM

## 2022-12-04 NOTE — Discharge Summary (Signed)
OB Discharge Summary  Patient Name: Sonya Ali DOB: 1990-08-17 MRN: [id number]  Date of admission: 12/03/2022 Delivering provider: Clance Boll A  Admitting diagnosis: Encounter for induction of labor [Z34.90] Intrauterine pregnancy: [redacted]w[redacted]d     Secondary diagnosis: Patient Active Problem List   Diagnosis Date Noted   Postpartum care following vaginal delivery 8/8 12/04/2022   Perineal laceration, second degree 12/04/2022   Maternal anemia, with delivery - ABL 12/04/2022   Encounter for induction of labor 12/03/2022   Arthritis 09/30/2021   Chronic fatigue syndrome 09/30/2021   Migraine without aura and without status migrainosus, not intractable 09/30/2021   ADHD, adult residual type 06/27/2021   Anxiety and depression 06/27/2021   SVD (spontaneous vaginal delivery) 06/21/2019   Additional problems:none   Date of discharge: 12/04/2022   Discharge diagnosis: Principal Problem:   Postpartum care following vaginal delivery 8/8 Active Problems:   SVD (spontaneous vaginal delivery)   Encounter for induction of labor   Perineal laceration, second degree   Maternal anemia, with delivery - ABL                                                              Post partum procedures: none  Augmentation: AROM, Pitocin, and Cytotec Pain control: Epidural Laceration:2nd degree Episiotomy:None Complications: None  Hospital course:  Induction of Labor With Vaginal Delivery   32 y.o. yo G2P2002 at [redacted]w[redacted]d was admitted to the hospital 12/03/2022 for induction of labor.  Indication for induction:  BMI 46 .  Patient had an labor course complicated by none Membrane Rupture Time/Date: 12:48 PM,12/03/2022  Delivery Method:Vaginal, Spontaneous Operative Delivery:N/A Episiotomy: None Lacerations:  2nd degree Details of delivery can be found in separate delivery note.  Patient had a postpartum course complicated by none. Patient is discharged home 12/04/22.  Newborn Data: Birth  date:12/03/2022 Birth time:5:48 PM Gender:Female Living status:Living Apgars:5 ,9  Weight:4030 g  Physical exam  Vitals:   12/03/22 2159 12/04/22 0052 12/04/22 0522 12/04/22 1000  BP: 117/72 111/65 124/76 (!) 144/66  Pulse: 82 86 85 99  Resp: 19 18 17 16   Temp: 98.9 F (37.2 C) 97.7 F (36.5 C) 97.8 F (36.6 C) 98 F (36.7 C)  TempSrc: Oral Oral Oral Oral  SpO2: 94% 98% 99% 99%  Weight:      Height:       General: alert, cooperative, and no distress Lochia: appropriate Uterine Fundus: firm Incision: N/A Perineum: repair intact, no edema DVT Evaluation: No cords or calf tenderness. No significant calf/ankle edema. Labs: Lab Results  Component Value Date   WBC 12.1 (H) 12/04/2022   HGB 9.8 (L) 12/04/2022   HCT 30.2 (L) 12/04/2022   MCV 84.8 12/04/2022   PLT 332 12/04/2022      Latest Ref Rng & Units 12/03/2022   12:33 AM  CMP  Glucose 70 - 99 mg/dL 82   BUN 6 - 20 mg/dL 11   Creatinine 1.91 - 1.00 mg/dL 4.78   Sodium 295 - 621 mmol/L 132   Potassium 3.5 - 5.1 mmol/L 3.9   Chloride 98 - 111 mmol/L 99   CO2 22 - 32 mmol/L 21   Calcium 8.9 - 10.3 mg/dL 9.1   Total Protein 6.5 - 8.1 g/dL 6.3   Total Bilirubin 0.3 -  1.2 mg/dL 0.4   Alkaline Phos 38 - 126 U/L 195   AST 15 - 41 U/L 24   ALT 0 - 44 U/L 17       06/23/2019    5:47 AM 06/22/2019    8:11 AM 06/21/2019    6:25 AM  Edinburgh Postnatal Depression Scale Screening Tool  I have been able to laugh and see the funny side of things. 0 -- --  I have looked forward with enjoyment to things. 0    I have blamed myself unnecessarily when things went wrong. 2    I have been anxious or worried for no good reason. 2    I have felt scared or panicky for no good reason. 1    Things have been getting on top of me. 1    I have been so unhappy that I have had difficulty sleeping. 0    I have felt sad or miserable. 1    I have been so unhappy that I have been crying. 1    The thought of harming myself has occurred to me. 0     Edinburgh Postnatal Depression Scale Total 8     Discharge instruction:  per After Visit Summary,  [facility] booklet and  "Understanding Mother & Baby Care" hospital booklet After Visit Meds:  Allergies as of 12/04/2022   No Known Allergies      Medication List     STOP taking these medications    amphetamine-dextroamphetamine 20 MG tablet Commonly known as: Adderall   amphetamine-dextroamphetamine 30 MG 24 hr capsule Commonly known as: Adderall XR   Lo Loestrin Fe 1 MG-10 MCG / 10 MCG tablet Generic drug: Norethindrone-Ethinyl Estradiol-Fe Biphas       TAKE these medications    acetaminophen 325 MG tablet Commonly known as: Tylenol Take 2 tablets (650 mg total) by mouth every 4 (four) hours as needed (for pain scale < 4).   benzocaine-Menthol 20-0.5 % Aero Commonly known as: DERMOPLAST Apply 1 Application topically as needed for irritation (perineal discomfort).   coconut oil Oil Apply 1 Application topically as needed.   ibuprofen 600 MG tablet Commonly known as: ADVIL Take 1 tablet (600 mg total) by mouth every 6 (six) hours.   iron polysaccharides 150 MG capsule Commonly known as: Ferrex 150 Take 1 capsule (150 mg total) by mouth every other day.   magnesium oxide 400 (240 Mg) MG tablet Commonly known as: MAG-OX Take 1 tablet (400 mg total) by mouth daily. For prevention of constipation.   sertraline 50 MG tablet Commonly known as: ZOLOFT Take 1 tablet (50 mg total) by mouth daily.   SUMAtriptan 50 MG tablet Commonly known as: IMITREX TAKE 1 TABLET (50 MG TOTAL) BY MOUTH EVERY 2 (TWO) HOURS AS NEEDED FOR MIGRAINE. TAKE WITH 2 NAPROXEN (ALEVE). MAY REPEAT THE SUMATRIPTAN IN 2 HOURS IF HEADACHE PERSISTS OR RECURS, MAX DOSE OF 4 PILLS IN 24 HOURS.               Discharge Care Instructions  (From admission, onward)           Start     Ordered   12/04/22 0000  Discharge wound care:       Comments: Sitz baths 2 times /day with warm  water x 1 week. May add herbals: 1 ounce dried comfrey leaf* 1 ounce calendula flowers 1 ounce lavender flowers  Supplies can be found online at [employer] sources at [employer],  [employer]  1/2 ounce dried uva ursi leaves 1/2 ounce witch hazel blossoms (if you can find them) 1/2 ounce dried sage leaf 1/2 cup sea salt Directions: Bring 2 quarts of water to a boil. Turn off heat, and place 1 ounce (approximately 1 large handful) of the above mixed herbs (not the salt) into the pot. Steep, covered, for 30 minutes.  Strain the liquid well with a fine mesh strainer, and discard the herb material. Add 2 quarts of liquid to the tub, along with the 1/2 cup of salt. This medicinal liquid can also be made into compresses and peri-rinses.   12/04/22 1112           Diet: iron rich diet Activity: Advance as tolerated. Pelvic rest for 6 weeks.  Postpartum contraception: TBA in office Newborn Data: Live born female  Birth Weight: 8 lb 14.2 oz (4030 g) APGAR: 5, 9  Newborn Delivery   Birth date/time: 12/03/2022 17:48:00 Delivery type: Vaginal, Spontaneous     Baby Feeding: Bottle and Breast Disposition:home with mother Circumcision: declined Delivery Report: Review the Delivery Report for details.   Follow up:  Follow-up Information     Law, Cassandra A, DO. Schedule an appointment as soon as possible for a visit in 6 week(s).   Specialty: Obstetrics and Gynecology Why: For Postpartum follow-up Contact information: [address] [place] [address] [phone]                Signed: Neta Mends, CNM, MSN 12/04/2022, 11:12 AM

## 2022-12-30 ENCOUNTER — Telehealth (HOSPITAL_COMMUNITY): Payer: Self-pay

## 2022-12-30 NOTE — Telephone Encounter (Signed)
12/30/2022 1535  Name: Zhane Cerny MRN: [id number] DOB: 1991/03/11  Reason for Call:  Transition of Care Hospital Discharge Call  Contact Status: Patient Contact Status: Message  Language assistant needed: Interpreter Mode: Interpreter Not Needed        Follow-Up Questions:    Inocente Salles Postnatal Depression Scale:  In the Past 7 Days:    PHQ2-9 Depression Scale:     Discharge Follow-up:    Post-discharge interventions: NA  Signature  Signe Colt

## 2023-02-18 ENCOUNTER — Encounter: Payer: Self-pay | Admitting: Family

## 2023-02-18 ENCOUNTER — Ambulatory Visit (INDEPENDENT_AMBULATORY_CARE_PROVIDER_SITE_OTHER): Payer: BC Managed Care – PPO | Admitting: Family

## 2023-02-18 VITALS — BP 122/80 | HR 74 | Temp 97.3°F | Ht 63.0 in | Wt 268.8 lb

## 2023-02-18 DIAGNOSIS — F908 Attention-deficit hyperactivity disorder, other type: Secondary | ICD-10-CM | POA: Diagnosis not present

## 2023-02-18 DIAGNOSIS — B009 Herpesviral infection, unspecified: Secondary | ICD-10-CM

## 2023-02-18 DIAGNOSIS — G43009 Migraine without aura, not intractable, without status migrainosus: Secondary | ICD-10-CM

## 2023-02-18 MED ORDER — AMPHETAMINE-DEXTROAMPHET ER 20 MG PO CP24
20.0000 mg | ORAL_CAPSULE | ORAL | 0 refills | Status: AC
Start: 2023-04-19 — End: 2023-05-19

## 2023-02-18 MED ORDER — AMPHETAMINE-DEXTROAMPHETAMINE 10 MG PO TABS
10.0000 mg | ORAL_TABLET | Freq: Every day | ORAL | 0 refills | Status: AC
Start: 2023-04-19 — End: 2023-05-19

## 2023-02-18 MED ORDER — AMPHETAMINE-DEXTROAMPHET ER 20 MG PO CP24
20.0000 mg | ORAL_CAPSULE | ORAL | 0 refills | Status: AC
Start: 2023-03-20 — End: 2023-04-19

## 2023-02-18 MED ORDER — ACYCLOVIR 5 % EX CREA
1.0000 | TOPICAL_CREAM | CUTANEOUS | 1 refills | Status: AC
Start: 2023-02-18 — End: ?

## 2023-02-18 MED ORDER — SUMATRIPTAN SUCCINATE 50 MG PO TABS
50.0000 mg | ORAL_TABLET | ORAL | 6 refills | Status: DC | PRN
Start: 2023-02-18 — End: 2023-09-30

## 2023-02-18 MED ORDER — AMPHETAMINE-DEXTROAMPHETAMINE 10 MG PO TABS
10.0000 mg | ORAL_TABLET | Freq: Every day | ORAL | 0 refills | Status: AC
Start: 2023-02-18 — End: 2023-03-20

## 2023-02-18 MED ORDER — VALACYCLOVIR HCL 1 G PO TABS
2000.0000 mg | ORAL_TABLET | Freq: Two times a day (BID) | ORAL | 2 refills | Status: AC
Start: 2023-02-18 — End: ?

## 2023-02-18 MED ORDER — AMPHETAMINE-DEXTROAMPHET ER 20 MG PO CP24
20.0000 mg | ORAL_CAPSULE | ORAL | 0 refills | Status: AC
Start: 2023-02-18 — End: 2023-03-20

## 2023-02-18 MED ORDER — AMPHETAMINE-DEXTROAMPHETAMINE 10 MG PO TABS
10.0000 mg | ORAL_TABLET | Freq: Every day | ORAL | 0 refills | Status: AC
Start: 2023-03-20 — End: 2023-04-19

## 2023-02-18 NOTE — Progress Notes (Signed)
Patient ID: Sonya Ali, female    DOB: 1990-09-02, 32 y.o.   MRN: [id number]  Chief Complaint  Patient presents with   ADHD    Pt would like to restart ADDERALL   Discussed the use of AI scribe software for clinical note transcription with the patient, who gave verbal consent to proceed.  History of Present Illness   The patient, a mother of two young children, presents with concerns about fatigue and difficulty focusing. She has recently returned to work full time after the birth of her second child and is finding it challenging to manage her responsibilities. She has a history of taking Adderall for focus and has been off the medication for about a year. She has tried to manage without it but feels that she cannot continue without the medication, especially with the demands of her job and caring for her children.  The patient also has a history of migraines and cold sores. She has been managing her migraines with sumatriptan and has one pill left. She has recently developed a cold sore and is requesting medication for it. She has previously used a prescription cream for her cold sores and found it effective.  The patient is currently breastfeeding her two-month-old baby and does not have concerns about the effects of the medications on her baby. She has a large supply of breast milk, has stored many bottles already and plans on pumping and dumping her milk before & after taking the medication to minimize any potential effects on her baby. She reports taking same meds with her first child doing the above measures & had no concerns or side effects noted with her baby.    Assessment & Plan:     Attention Deficit Hyperactivity Disorder (ADHD) - Patient reports difficulty focusing and managing daily tasks since returning to work full time postpartum. Previously managed on Adderall 30mg  XR and 20mg  IR. Will send lower doses d/t breastfeeding, advised to pump & dump for 8 hours after  taking meds, use stored supply of milk first. -Resume Adderall 20mg  XR daily and 10mg  IR as needed. -Schedule virtual follow-up appointment in 3 months.  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 -Prescribe Valtrex 2g orally twice daily for one day and topical antiviral cream as needed. -F/U prn  Migraine - Patient reports occasional migraines, managed with Sumatriptan 50mg  as needed. -Refill Sumatriptan 50mg  prescription. Instruct patient to pump and dump breast milk for 8 hours after taking medication.  - f/u in 6mos or prn     Subjective:    Outpatient Medications Prior to Visit  Medication Sig Dispense Refill   acetaminophen (TYLENOL) 325 MG tablet Take 2 tablets (650 mg total) by mouth every 4 (four) hours as needed (for pain scale < 4).     benzocaine-Menthol (DERMOPLAST) 20-0.5 % AERO Apply 1 Application topically as needed for irritation (perineal discomfort).     coconut oil OIL Apply 1 Application topically as needed.     ibuprofen (ADVIL) 600 MG tablet Take 1 tablet (600 mg total) by mouth every 6 (six) hours. 30 tablet 0   iron polysaccharides (FERREX 150) 150 MG capsule Take 1 capsule (150 mg total) by mouth every other day.     magnesium oxide (MAG-OX) 400 (240 Mg) MG tablet Take 1 tablet (400 mg total) by mouth daily. For prevention of constipation.     sertraline (ZOLOFT) 50 MG tablet Take 1 tablet (50 mg total) by mouth daily. 90 tablet 1  SUMAtriptan (IMITREX) 50 MG tablet TAKE 1 TABLET (50 MG TOTAL) BY MOUTH EVERY 2 (TWO) HOURS AS NEEDED FOR MIGRAINE. TAKE WITH 2 NAPROXEN (ALEVE). MAY REPEAT THE SUMATRIPTAN IN 2 HOURS IF HEADACHE PERSISTS OR RECURS, MAX DOSE OF 4 PILLS IN 24 HOURS. (Patient not taking: Reported on 02/18/2023) 9 tablet 6   No facility-administered medications prior to visit.   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  Constitutional:      Appearance: Normal appearance. She is obese.  Cardiovascular:     Rate and Rhythm: Normal rate and regular rhythm.  Pulmonary:     Effort: Pulmonary effort is normal.     Breath sounds: Normal breath sounds.  Musculoskeletal:        General: Normal range of motion.  Skin:    General: Skin is warm and dry.  Neurological:     Mental Status: She is alert.  Psychiatric:        Mood and Affect: Mood normal.        Behavior: Behavior normal.    BP 122/80   Pulse 74   Temp (!) 97.3 F (36.3 C)   Ht 5\' 3"  (1.6 m)   Wt 268 lb 12.8 oz (121.9 kg)   LMP  (LMP Unknown)   SpO2 99%   BMI 47.62 kg/m  Wt Readings from Last 3 Encounters:  02/18/23 268 lb 12.8 oz (121.9 kg)  12/03/22 296 lb 3.2 oz (134.4 kg)  01/27/22 257 lb (116.6 kg)       Dulce Sellar, NP

## 2023-02-18 NOTE — Assessment & Plan Note (Addendum)
chronic, stable occasional migraines, managed with Sumatriptan 50mg  as needed. Refilling Sumatriptan 50mg  prescription advised patient to pump and dump breast milk for 8 hours after taking medication while breast feeding. f/u 6mos or prn

## 2023-02-18 NOTE — Assessment & Plan Note (Signed)
chronic, unstable Patient reports difficulty focusing and managing daily tasks since returning to work full time postpartum.  Previously  on Adderall 30mg  XR and 20mg  IR sending lower doses d/t breastfeeding, advised to pump & dump for 8 hours after taking meds, use stored supply of milk first. Adderall 20mg  XR daily and 10mg  IR as needed. virtual follow-up appointment in 3 months.

## 2023-05-22 ENCOUNTER — Telehealth (INDEPENDENT_AMBULATORY_CARE_PROVIDER_SITE_OTHER): Payer: BC Managed Care – PPO | Admitting: Family

## 2023-05-22 ENCOUNTER — Encounter: Payer: Self-pay | Admitting: Family

## 2023-05-22 DIAGNOSIS — F908 Attention-deficit hyperactivity disorder, other type: Secondary | ICD-10-CM | POA: Diagnosis not present

## 2023-05-22 MED ORDER — AMPHETAMINE-DEXTROAMPHETAMINE 15 MG PO TABS
15.0000 mg | ORAL_TABLET | Freq: Every day | ORAL | 0 refills | Status: AC
Start: 2023-07-21 — End: 2023-08-20

## 2023-05-22 MED ORDER — AMPHETAMINE-DEXTROAMPHET ER 20 MG PO CP24
20.0000 mg | ORAL_CAPSULE | ORAL | 0 refills | Status: AC
Start: 2023-05-22 — End: 2023-06-21

## 2023-05-22 MED ORDER — AMPHETAMINE-DEXTROAMPHETAMINE 15 MG PO TABS
15.0000 mg | ORAL_TABLET | Freq: Every day | ORAL | 0 refills | Status: AC
Start: 2023-05-22 — End: 2023-06-21

## 2023-05-22 MED ORDER — AMPHETAMINE-DEXTROAMPHETAMINE 15 MG PO TABS
15.0000 mg | ORAL_TABLET | Freq: Every day | ORAL | 0 refills | Status: AC
Start: 2023-06-21 — End: 2023-07-21

## 2023-05-22 MED ORDER — AMPHETAMINE-DEXTROAMPHET ER 20 MG PO CP24
20.0000 mg | ORAL_CAPSULE | ORAL | 0 refills | Status: AC
Start: 2023-07-21 — End: 2023-08-20

## 2023-05-22 MED ORDER — AMPHETAMINE-DEXTROAMPHET ER 20 MG PO CP24
20.0000 mg | ORAL_CAPSULE | ORAL | 0 refills | Status: AC
Start: 2023-06-21 — End: 2023-07-21

## 2023-05-22 NOTE — Progress Notes (Signed)
MyChart Video Visit    Virtual Visit via Video Note   This format is felt to be most appropriate for this patient at this time. Physical exam was limited by quality of the video and audio technology used for the visit. CMA was able to get the patient set up on a video visit.  Patient location: Home. Patient and provider in visit Provider location: Office  I discussed the limitations of evaluation and management by telemedicine and the availability of in person appointments. The patient expressed understanding and agreed to proceed.  Visit Date: 05/22/2023  Today's healthcare provider: Dulce Sellar, NP     Subjective:   Patient ID: Sonya Ali, female    DOB: 1990-05-17, 33 y.o.   MRN: [id number]  Chief Complaint  Patient presents with   ADHD    Medication refills   Discussed the use of AI scribe software for clinical note transcription with the patient, who gave verbal consent to proceed.  History of Present Illness   The patient, with a history of ADHD, presents for a medication adjustment. She reports she is no longer breastfeeding, and her current regimen of extended-release medication is not lasting throughout the day, leading to difficulty focusing and easy distractibility. She also takes a short-acting medication, which she has been taking in the afternoon. She denies any sleep disturbances related to her medication. The patient works eight-hour shifts and has extra medication on hand. She expresses concern about potential shortages of her medication and discusses the possibility of switching pharmacies if necessary. She also inquires about the possibility of receiving a 90-day supply of medication.     Assessment & Plan:     Attention Deficit Hyperactivity Disorder (ADHD) - Patient reports that extended release Adderall is not lasting throughout the day. No sleep disturbances reported. Denies any SE with restarting meds. -Refilling Adderall 20mg  XR,  3 separate 30d RX. -Increase dose of immediate release medication to 15mg  daily, take this earlier in day around 12-1p. -F/U in 3 months in office or sooner if needed to assess efficacy of new regimen.      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     benzocaine-Menthol (DERMOPLAST) 20-0.5 % AERO Apply 1 Application topically as needed for irritation (perineal discomfort).     coconut oil OIL Apply 1 Application topically as needed.     ibuprofen (ADVIL) 600 MG tablet Take 1 tablet (600 mg total) by mouth every 6 (six) hours. 30 tablet 0   iron polysaccharides (FERREX 150) 150 MG capsule Take 1 capsule (150 mg total) by mouth every other day.     magnesium oxide (MAG-OX) 400 (240 Mg) MG tablet Take 1 tablet (400 mg total) by mouth daily. For prevention of constipation.     sertraline (ZOLOFT) 50 MG tablet Take 1 tablet (50 mg total) by mouth daily. 90 tablet 1   SUMAtriptan (IMITREX) 50 MG tablet Take 1 tablet (  50 mg total) by mouth every 2 (two) hours as needed for migraine. Take with 2 Naproxen (Aleve). May repeat the Sumatriptan in 2 hours if headache persists or recurs, max dose of 4 pills in 24 hours. 9 tablet 6   valACYclovir (VALTREX) 1000 MG tablet Take 2 tablets (2,000 mg total) by mouth every 12 (twelve) hours. Take for 1 day 4 tablet 2   acyclovir cream (ZOVIRAX) 5 % Apply 1 Application topically every 3 (three) hours. Apply to cold sore as needed. (Patient not  taking: Reported on 05/22/2023) 15 g 1   amphetamine-dextroamphetamine (ADDERALL XR) 20 MG 24 hr capsule Take 1 capsule (20 mg total) by mouth every morning. 30 capsule 0   amphetamine-dextroamphetamine (ADDERALL XR) 20 MG 24 hr capsule Take 1 capsule (20 mg total) by mouth every morning. 30 capsule 0   amphetamine-dextroamphetamine (ADDERALL XR) 20 MG 24 hr capsule Take 1 capsule (20 mg total) by mouth every morning. 30 capsule 0   amphetamine-dextroamphetamine (ADDERALL) 10 MG tablet Take 1 tablet (10 mg total) by mouth daily after lunch. 30 tablet 0   amphetamine-dextroamphetamine (ADDERALL) 10 MG tablet Take 1 tablet (10 mg total) by mouth daily after lunch. 30 tablet 0   amphetamine-dextroamphetamine (ADDERALL) 10 MG tablet Take 1 tablet (10 mg total) by mouth daily after lunch. 30 tablet 0   No facility-administered medications prior to visit.    No Known Allergies     Objective:   Physical Exam Vitals and nursing note reviewed.  Constitutional:      General: Pt is not in acute distress.    Appearance: Normal appearance.  HENT:     Head: Normocephalic.  Pulmonary:     Effort: No respiratory distress.  Musculoskeletal:     Cervical back: Normal range of motion.  Skin:    General: Skin is dry.     Coloration: Skin is not pale.  Neurological:     Mental Status: Pt is alert and oriented to person, place, and time.  Psychiatric:        Mood and Affect: Mood normal.   Breastfeeding No   Wt Readings from Last 3 Encounters:  02/18/23 268 lb 12.8 oz (121.9 kg)  12/03/22 296 lb 3.2 oz (134.4 kg)  01/27/22 257 lb (116.6 kg)     I discussed the assessment and treatment plan with the patient. The patient was provided an opportunity to ask questions and all were answered. The patient agreed with the plan and demonstrated an understanding of the instructions.   The patient was advised to call back or seek an in-person evaluation if the symptoms worsen or if the condition fails to  improve as anticipated.  Dulce Sellar, NP [facility] at [facility] [phone] (phone) [phone] (fax)  [facility]

## 2023-05-22 NOTE — Assessment & Plan Note (Signed)
Attention Deficit Hyperactivity Disorder (ADHD) - Patient reports that extended release Adderall is not lasting throughout the day. No sleep disturbances reported. Denies any SE with restarting meds. -Refilling Adderall 20mg  XR, 3 separate 30d RX. -Increase dose of immediate release medication to 15mg  daily, take this earlier in day around 12-1p. -F/U in 3 months in office or sooner if needed to assess efficacy of new regimen.

## 2023-09-30 ENCOUNTER — Other Ambulatory Visit: Payer: Self-pay | Admitting: Family

## 2023-09-30 ENCOUNTER — Other Ambulatory Visit: Payer: Self-pay

## 2023-09-30 DIAGNOSIS — G43009 Migraine without aura, not intractable, without status migrainosus: Secondary | ICD-10-CM

## 2023-09-30 MED ORDER — SUMATRIPTAN SUCCINATE 50 MG PO TABS: 50.0000 mg | ORAL_TABLET | ORAL | 6 refills | Status: AC | PRN
